# Patient Record
Sex: Female | Born: 1955 | Race: White | Hispanic: No | Smoking: Never smoker
Health system: Southern US, Community
[De-identification: ages and names within clinical notes are randomized; demographics above are authoritative.]

## PROBLEM LIST (undated history)

## (undated) HISTORY — PX: TUBAL LIGATION: SHX77

## (undated) HISTORY — PX: CHOLECYSTECTOMY: SHX55

## (undated) HISTORY — PX: NM RENAL LASIX (ARMC HX): HXRAD1213

## (undated) HISTORY — PX: BREAST BIOPSY: SHX20

---

## 2014-02-17 LAB — HM PAP SMEAR: HM Pap smear: NEGATIVE

## 2015-02-28 LAB — HM DEXA SCAN

## 2017-03-20 LAB — HM MAMMOGRAPHY

## 2018-06-19 ENCOUNTER — Other Ambulatory Visit: Payer: Self-pay | Admitting: Family Medicine

## 2018-06-19 DIAGNOSIS — Z1231 Encounter for screening mammogram for malignant neoplasm of breast: Secondary | ICD-10-CM

## 2018-07-01 ENCOUNTER — Other Ambulatory Visit: Payer: Self-pay | Admitting: Family Medicine

## 2018-07-01 DIAGNOSIS — Z13820 Encounter for screening for osteoporosis: Secondary | ICD-10-CM

## 2018-08-06 ENCOUNTER — Other Ambulatory Visit: Payer: Self-pay

## 2018-08-26 ENCOUNTER — Other Ambulatory Visit: Payer: Self-pay

## 2018-10-07 ENCOUNTER — Other Ambulatory Visit: Payer: Self-pay

## 2018-12-30 ENCOUNTER — Other Ambulatory Visit: Payer: Self-pay

## 2018-12-30 ENCOUNTER — Ambulatory Visit
Admission: RE | Admit: 2018-12-30 | Discharge: 2018-12-30 | Disposition: A | Discharge status: Home / Self Care | Payer: 59 | Source: Ambulatory Visit | Attending: Family Medicine | Admitting: Family Medicine

## 2018-12-30 DIAGNOSIS — Z1231 Encounter for screening mammogram for malignant neoplasm of breast: Secondary | ICD-10-CM | POA: Insufficient documentation

## 2018-12-30 DIAGNOSIS — Z13820 Encounter for screening for osteoporosis: Secondary | ICD-10-CM | POA: Insufficient documentation

## 2019-01-20 ENCOUNTER — Other Ambulatory Visit: Payer: Self-pay | Admitting: Family Medicine

## 2019-01-20 DIAGNOSIS — R928 Other abnormal and inconclusive findings on diagnostic imaging of breast: Secondary | ICD-10-CM

## 2019-01-27 ENCOUNTER — Other Ambulatory Visit: Payer: 59

## 2019-01-27 ENCOUNTER — Ambulatory Visit: Payer: 59

## 2019-02-10 ENCOUNTER — Other Ambulatory Visit: Payer: Self-pay | Admitting: Family Medicine

## 2019-02-10 DIAGNOSIS — R928 Other abnormal and inconclusive findings on diagnostic imaging of breast: Secondary | ICD-10-CM

## 2019-02-18 ENCOUNTER — Ambulatory Visit
Admission: RE | Admit: 2019-02-18 | Discharge: 2019-02-18 | Disposition: A | Discharge status: Home / Self Care | Payer: 59 | Source: Ambulatory Visit | Attending: Family Medicine | Admitting: Family Medicine

## 2019-02-18 DIAGNOSIS — R928 Other abnormal and inconclusive findings on diagnostic imaging of breast: Secondary | ICD-10-CM

## 2019-07-02 ENCOUNTER — Ambulatory Visit: Payer: 59 | Admitting: Nurse Practitioner

## 2019-07-07 ENCOUNTER — Other Ambulatory Visit: Payer: Self-pay

## 2019-07-07 ENCOUNTER — Ambulatory Visit (INDEPENDENT_AMBULATORY_CARE_PROVIDER_SITE_OTHER): Payer: No Typology Code available for payment source | Admitting: Nurse Practitioner

## 2019-07-07 ENCOUNTER — Encounter: Payer: Self-pay | Admitting: Nurse Practitioner

## 2019-07-07 VITALS — BP 105/70 | HR 69 | Temp 98.1°F | Ht 61.81 in | Wt 132.6 lb

## 2019-07-07 DIAGNOSIS — Z7689 Persons encountering health services in other specified circumstances: Secondary | ICD-10-CM

## 2019-07-07 DIAGNOSIS — M8580 Other specified disorders of bone density and structure, unspecified site: Secondary | ICD-10-CM | POA: Insufficient documentation

## 2019-07-07 DIAGNOSIS — E039 Hypothyroidism, unspecified: Secondary | ICD-10-CM

## 2019-07-07 DIAGNOSIS — M85851 Other specified disorders of bone density and structure, right thigh: Secondary | ICD-10-CM

## 2019-07-07 NOTE — Assessment & Plan Note (Signed)
Chronic, ongoing.  Continue current medication regimen and adjust as needed.  Plan on thyroid panel at next visit in 4 weeks, physical.

## 2019-07-07 NOTE — Patient Instructions (Signed)
Hypothyroidism  Hypothyroidism is when the thyroid gland does not make enough of certain hormones (it is underactive). The thyroid gland is a small gland located in the lower front part of the neck, just in front of the windpipe (trachea). This gland makes hormones that help control how the body uses food for energy (metabolism) as well as how the heart and brain function. These hormones also play a role in keeping your bones strong. When the thyroid is underactive, it produces too little of the hormones thyroxine (T4) and triiodothyronine (T3). What are the causes? This condition may be caused by:  Hashimoto's disease. This is a disease in which the body's disease-fighting system (immune system) attacks the thyroid gland. This is the most common cause.  Viral infections.  Pregnancy.  Certain medicines.  Birth defects.  Past radiation treatments to the head or neck for cancer.  Past treatment with radioactive iodine.  Past exposure to radiation in the environment.  Past surgical removal of part or all of the thyroid.  Problems with a gland in the center of the brain (pituitary gland).  Lack of enough iodine in the diet. What increases the risk? You are more likely to develop this condition if:  You are female.  You have a family history of thyroid conditions.  You use a medicine called lithium.  You take medicines that affect the immune system (immunosuppressants). What are the signs or symptoms? Symptoms of this condition include:  Feeling as though you have no energy (lethargy).  Not being able to tolerate cold.  Weight gain that is not explained by a change in diet or exercise habits.  Lack of appetite.  Dry skin.  Coarse hair.  Menstrual irregularity.  Slowing of thought processes.  Constipation.  Sadness or depression. How is this diagnosed? This condition may be diagnosed based on:  Your symptoms, your medical history, and a physical exam.  Blood  tests. You may also have imaging tests, such as an ultrasound or MRI. How is this treated? This condition is treated with medicine that replaces the thyroid hormones that your body does not make. After you begin treatment, it may take several weeks for symptoms to go away. Follow these instructions at home:  Take over-the-counter and prescription medicines only as told by your health care provider.  If you start taking any new medicines, tell your health care provider.  Keep all follow-up visits as told by your health care provider. This is important. ? As your condition improves, your dosage of thyroid hormone medicine may change. ? You will need to have blood tests regularly so that your health care provider can monitor your condition. Contact a health care provider if:  Your symptoms do not get better with treatment.  You are taking thyroid replacement medicine and you: ? Sweat a lot. ? Have tremors. ? Feel anxious. ? Lose weight rapidly. ? Cannot tolerate heat. ? Have emotional swings. ? Have diarrhea. ? Feel weak. Get help right away if you have:  Chest pain.  An irregular heartbeat.  A rapid heartbeat.  Difficulty breathing. Summary  Hypothyroidism is when the thyroid gland does not make enough of certain hormones (it is underactive).  When the thyroid is underactive, it produces too little of the hormones thyroxine (T4) and triiodothyronine (T3).  The most common cause is Hashimoto's disease, a disease in which the body's disease-fighting system (immune system) attacks the thyroid gland. The condition can also be caused by viral infections, medicine, pregnancy, or past   radiation treatment to the head or neck.  Symptoms may include weight gain, dry skin, constipation, feeling as though you do not have energy, and not being able to tolerate cold.  This condition is treated with medicine to replace the thyroid hormones that your body does not make. This information  is not intended to replace advice given to you by your health care provider. Make sure you discuss any questions you have with your health care provider. Document Revised: 04/25/2017 Document Reviewed: 04/23/2017 Elsevier Patient Education  2020 [employer]  

## 2019-07-07 NOTE — Progress Notes (Signed)
New Patient Office Visit  Subjective:  Patient ID: Ashley Lawrence, female    DOB: May 15, 1956  Age: 64 y.o. MRN: [id number]  CC:  Chief Complaint  Patient presents with  . Establish Care    HPI Ashley Lawrence presents for new patient visit to establish care.  Introduced to Publishing rights manager role and practice setting.  All questions answered.  Works for [employer] and lives in [place].  [place] here [place] in [place].  Lived there for 30 years.  Saw nurse practitioners while living there.  Last physical was January 2020, by Dr. Allena Katz.  HYPOTHYROIDISM Is taking Levothyroxine 50 MCG, diagnosed 5 years ago.  Did have osteopenia noted on DEXA in 2020, January, takes daily supplements. Thyroid control status:stable Satisfied with current treatment? yes Medication side effects: no Medication compliance: good compliance Etiology of hypothyroidism:  Recent dose adjustment:no Fatigue: no Cold intolerance: no Heat intolerance: no Weight gain: no Weight loss: no Constipation: no Diarrhea/loose stools: no Palpitations: no Lower extremity edema: no Anxiety/depressed mood: no   History reviewed. No pertinent past medical history.  Past Surgical History:  Procedure Laterality Date  . BREAST BIOPSY Left    neg  . CHOLECYSTECTOMY    . NM RENAL LASIX ([facility] HX)    . TUBAL LIGATION      Family History  Problem Relation Age of Onset  . Breast cancer Neg Hx     Social History   Socioeconomic History  . Marital status: Unknown    Spouse name: Not on file  . Number of children: Not on file  . Years of education: Not on file  . Highest education level: Not on file  Occupational History    Employer: [employer]    Comment: contract stuff  Tobacco Use  . Smoking status: Never Smoker  . Smokeless tobacco: Never Used  Substance and Sexual Activity  . Alcohol use: Never  . Drug use: Never  . Sexual activity: Yes  Other Topics Concern  . Not on file  Social History Narrative  . Not on file    Social Determinants of Health   Financial Resource Strain: Low Risk   . Difficulty of Paying Living Expenses: Not hard at all  Food Insecurity: No Food Insecurity  . Worried About Programme researcher, broadcasting/film/video in the Last Year: Never true  . Ran Out of Food in the Last Year: Never true  Transportation Needs: No Transportation Needs  . Lack of Transportation (Medical): No  . Lack of Transportation (Non-Medical): No  Physical Activity: Sufficiently Active  . Days of Exercise per Week: 5 days  . Minutes of Exercise per Session: 30 min  Stress: No Stress Concern Present  . Feeling of Stress : Not at all  Social Connections: Slightly Isolated  . Frequency of Communication with Friends and Family: Three times a week  . Frequency of Social Gatherings with Friends and Family: Three times a week  . Attends Religious Services: More than 4 times per year  . Active Member of Clubs or Organizations: No  . Attends Banker Meetings: Never  . Marital Status: Married  Catering manager Violence:   . Fear of Current or Ex-Partner: Not on file  . Emotionally Abused: Not on file  . Physically Abused: Not on file  . Sexually Abused: Not on file    ROS Review of Systems  Constitutional: Negative for activity change, appetite change, diaphoresis, fatigue and fever.  Respiratory: Negative for cough, chest tightness and shortness of breath.  Cardiovascular: Negative for chest pain, palpitations and leg swelling.  Gastrointestinal: Negative.   Endocrine: Negative for cold intolerance and heat intolerance.  Neurological: Negative.   Psychiatric/Behavioral: Negative.     Objective:   Today's Vitals: BP 105/70 (BP Location: Left Arm, Patient Position: Sitting, Cuff Size: Normal)   Pulse 69   Temp 98.1 F (36.7 C) (Oral)   Ht 5' 1.81" (1.57 m)   Wt 132 lb 9.6 oz (60.1 kg)   SpO2 100%   BMI 24.40 kg/m   Physical Exam Vitals and nursing note reviewed.  Constitutional:      General: She  is awake. She is not in acute distress.    Appearance: She is well-developed and well-groomed. She is not ill-appearing.  HENT:     Head: Normocephalic.     Right Ear: Hearing normal.     Left Ear: Hearing normal.  Eyes:     General: Lids are normal.        Right eye: No discharge.        Left eye: No discharge.     Conjunctiva/sclera: Conjunctivae normal.     Pupils: Pupils are equal, round, and reactive to light.  Neck:     Thyroid: No thyromegaly.     Vascular: No carotid bruit.  Cardiovascular:     Rate and Rhythm: Normal rate and regular rhythm.     Heart sounds: Normal heart sounds. No murmur. No gallop.   Pulmonary:     Effort: Pulmonary effort is normal. No accessory muscle usage or respiratory distress.     Breath sounds: Normal breath sounds.  Abdominal:     General: Bowel sounds are normal.     Palpations: Abdomen is soft.  Musculoskeletal:     Cervical back: Normal range of motion and neck supple.     Right lower leg: No edema.     Left lower leg: No edema.  Lymphadenopathy:     Head:     Right side of head: No submental, submandibular, tonsillar, preauricular or posterior auricular adenopathy.     Left side of head: No submental, submandibular, tonsillar, preauricular or posterior auricular adenopathy.  Skin:    General: Skin is warm and dry.  Neurological:     Mental Status: She is alert and oriented to person, place, and time.  Psychiatric:        Attention and Perception: Attention normal.        Mood and Affect: Mood normal.        Behavior: Behavior normal. Behavior is cooperative.        Thought Content: Thought content normal.        Judgment: Judgment normal.     Assessment & Plan:   Problem List Items Addressed This Visit      Endocrine   Hypothyroid    Chronic, ongoing.  Continue current medication regimen and adjust as needed.  Plan on thyroid panel at next visit in 4 weeks, physical.      Relevant Medications   levothyroxine (SYNTHROID)  50 MCG tablet     Musculoskeletal and Integument   Osteopenia    Noted on DEXA in 2020.  Continue on calcium and Vit D supplement daily.  Repeat DEXA in 2025.  Check Vit D at physical in 4 weeks.       Other Visit Diagnoses    Encounter to establish care    -  Primary      Outpatient Encounter Medications as of 07/07/2019  Medication Sig  .  calcium carbonate (OSCAL) 1500 (600 Ca) MG TABS tablet Take 600 mg by mouth 2 (two) times daily with a meal.  . cholecalciferol (VITAMIN D3) 25 MCG (1000 UNIT) tablet Take 4,000 Units by mouth daily.  Marland Kitchen levothyroxine (SYNTHROID) 50 MCG tablet Take 50 mcg by mouth daily.   No facility-administered encounter medications on file as of 07/07/2019.    Follow-up: Return in about 4 weeks (around 08/04/2019) for Annual physical.   Venita Lick, NP

## 2019-07-07 NOTE — Assessment & Plan Note (Addendum)
Noted on DEXA in 2020.  Continue on calcium and Vit D supplement daily.  Repeat DEXA in 2025.  Check Vit D at physical in 4 weeks.

## 2019-08-11 ENCOUNTER — Other Ambulatory Visit: Payer: Self-pay

## 2019-08-11 ENCOUNTER — Ambulatory Visit (INDEPENDENT_AMBULATORY_CARE_PROVIDER_SITE_OTHER): Payer: 59 | Admitting: Nurse Practitioner

## 2019-08-11 ENCOUNTER — Other Ambulatory Visit (HOSPITAL_COMMUNITY)
Admission: RE | Admit: 2019-08-11 | Discharge: 2019-08-11 | Disposition: A | Discharge status: Home / Self Care | Payer: 59 | Source: Ambulatory Visit | Attending: Nurse Practitioner | Admitting: Nurse Practitioner

## 2019-08-11 ENCOUNTER — Encounter: Payer: Self-pay | Admitting: Nurse Practitioner

## 2019-08-11 VITALS — BP 89/57 | HR 65 | Temp 97.7°F | Ht 62.6 in | Wt 132.2 lb

## 2019-08-11 DIAGNOSIS — M85851 Other specified disorders of bone density and structure, right thigh: Secondary | ICD-10-CM | POA: Diagnosis not present

## 2019-08-11 DIAGNOSIS — Z0000 Encounter for general adult medical examination without abnormal findings: Secondary | ICD-10-CM

## 2019-08-11 DIAGNOSIS — Z124 Encounter for screening for malignant neoplasm of cervix: Secondary | ICD-10-CM | POA: Diagnosis not present

## 2019-08-11 DIAGNOSIS — E039 Hypothyroidism, unspecified: Secondary | ICD-10-CM | POA: Diagnosis not present

## 2019-08-11 DIAGNOSIS — Z13220 Encounter for screening for lipoid disorders: Secondary | ICD-10-CM

## 2019-08-11 DIAGNOSIS — Z1231 Encounter for screening mammogram for malignant neoplasm of breast: Secondary | ICD-10-CM

## 2019-08-11 DIAGNOSIS — E559 Vitamin D deficiency, unspecified: Secondary | ICD-10-CM

## 2019-08-11 NOTE — Patient Instructions (Signed)
Healthy Eating Following a healthy eating pattern may help you to achieve and maintain a healthy body weight, reduce the risk of chronic disease, and live a long and productive life. It is important to follow a healthy eating pattern at an appropriate calorie level for your body. Your nutritional needs should be met primarily through food by choosing a variety of nutrient-rich foods. What are tips for following this plan? Reading food labels  Read labels and choose the following: ? Reduced or low sodium. ? Juices with 100% fruit juice. ? Foods with low saturated fats and high polyunsaturated and monounsaturated fats. ? Foods with whole grains, such as whole wheat, cracked wheat, brown rice, and wild rice. ? Whole grains that are fortified with folic acid. This is recommended for women who are pregnant or who want to become pregnant.  Read labels and avoid the following: ? Foods with a lot of added sugars. These include foods that contain brown sugar, corn sweetener, corn syrup, dextrose, fructose, glucose, high-fructose corn syrup, honey, invert sugar, lactose, malt syrup, maltose, molasses, raw sugar, sucrose, trehalose, or turbinado sugar.  Do not eat more than the following amounts of added sugar per day:  6 teaspoons (25 g) for women.  9 teaspoons (38 g) for men. ? Foods that contain processed or refined starches and grains. ? Refined grain products, such as white flour, degermed cornmeal, white bread, and white rice. Shopping  Choose nutrient-rich snacks, such as vegetables, whole fruits, and nuts. Avoid high-calorie and high-sugar snacks, such as potato chips, fruit snacks, and candy.  Use oil-based dressings and spreads on foods instead of solid fats such as butter, stick margarine, or cream cheese.  Limit pre-made sauces, mixes, and "instant" products such as flavored rice, instant noodles, and ready-made pasta.  Try more plant-protein sources, such as tofu, tempeh, black beans,  edamame, lentils, nuts, and seeds.  Explore eating plans such as the Mediterranean diet or vegetarian diet. Cooking  Use oil to saut or stir-fry foods instead of solid fats such as butter, stick margarine, or lard.  Try baking, boiling, grilling, or broiling instead of frying.  Remove the fatty part of meats before cooking.  Steam vegetables in water or broth. Meal planning   At meals, imagine dividing your plate into fourths: ? One-half of your plate is fruits and vegetables. ? One-fourth of your plate is whole grains. ? One-fourth of your plate is protein, especially lean meats, poultry, eggs, tofu, beans, or nuts.  Include low-fat dairy as part of your daily diet. Lifestyle  Choose healthy options in all settings, including home, work, school, restaurants, or stores.  Prepare your food safely: ? Wash your hands after handling raw meats. ? Keep food preparation surfaces clean by regularly washing with hot, soapy water. ? Keep raw meats separate from ready-to-eat foods, such as fruits and vegetables. ? Cook seafood, meat, poultry, and eggs to the recommended internal temperature. ? Store foods at safe temperatures. In general:  Keep cold foods at 59F (4.4C) or below.  Keep hot foods at 159F (60C) or above.  Keep your freezer at [facility] (-17.8C) or below.  Foods are no longer safe to eat when they have been between the temperatures of 40-159F (4.4-60C) for more than 2 hours. What foods should I eat? Fruits Aim to eat 2 cup-equivalents of fresh, canned (in natural juice), or frozen fruits each day. Examples of 1 cup-equivalent of fruit include 1 small apple, 8 large strawberries, 1 cup canned fruit,  cup  dried fruit, or 1 cup 100% juice. Vegetables Aim to eat 2-3 cup-equivalents of fresh and frozen vegetables each day, including different varieties and colors. Examples of 1 cup-equivalent of vegetables include 2 medium carrots, 2 cups raw, leafy greens, 1 cup chopped  vegetable (raw or cooked), or 1 medium baked potato. Grains Aim to eat 6 ounce-equivalents of whole grains each day. Examples of 1 ounce-equivalent of grains include 1 slice of bread, 1 cup ready-to-eat cereal, 3 cups popcorn, or  cup cooked rice, pasta, or cereal. Meats and other proteins Aim to eat 5-6 ounce-equivalents of protein each day. Examples of 1 ounce-equivalent of protein include 1 egg, 1/2 cup nuts or seeds, or 1 tablespoon (16 g) peanut butter. A cut of meat or fish that is the size of a deck of cards is about 3-4 ounce-equivalents.  Of the protein you eat each week, try to have at least 8 ounces come from seafood. This includes salmon, trout, herring, and anchovies. Dairy Aim to eat 3 cup-equivalents of fat-free or low-fat dairy each day. Examples of 1 cup-equivalent of dairy include 1 cup (240 mL) milk, 8 ounces (250 g) yogurt, 1 ounces (44 g) natural cheese, or 1 cup (240 mL) fortified soy milk. Fats and oils  Aim for about 5 teaspoons (21 g) per day. Choose monounsaturated fats, such as canola and olive oils, avocados, peanut butter, and most nuts, or polyunsaturated fats, such as sunflower, corn, and soybean oils, walnuts, pine nuts, sesame seeds, sunflower seeds, and flaxseed. Beverages  Aim for six 8-oz glasses of water per day. Limit coffee to three to five 8-oz cups per day.  Limit caffeinated beverages that have added calories, such as soda and energy drinks.  Limit alcohol intake to no more than 1 drink a day for nonpregnant women and 2 drinks a day for men. One drink equals 12 oz of beer (355 mL), 5 oz of wine (148 mL), or 1 oz of hard liquor (44 mL). Seasoning and other foods  Avoid adding excess amounts of salt to your foods. Try flavoring foods with herbs and spices instead of salt.  Avoid adding sugar to foods.  Try using oil-based dressings, sauces, and spreads instead of solid fats. This information is based on general [place] nutrition guidelines. For more  information, visit [employer]. Exact amounts may vary based on your nutrition needs. Summary  A healthy eating plan may help you to maintain a healthy weight, reduce the risk of chronic diseases, and stay active throughout your life.  Plan your meals. Make sure you eat the right portions of a variety of nutrient-rich foods.  Try baking, boiling, grilling, or broiling instead of frying.  Choose healthy options in all settings, including home, work, school, restaurants, or stores. This information is not intended to replace advice given to you by your health care provider. Make sure you discuss any questions you have with your health care provider. Document Revised: 08/25/2017 Document Reviewed: 08/25/2017 Elsevier Patient Education  [place].

## 2019-08-11 NOTE — Progress Notes (Signed)
BP (!) 89/57   Pulse 65   Temp 97.7 F (36.5 C) (Oral)   Ht 5' 2.6" (1.59 m)   Wt 132 lb 3.2 oz (60 kg)   SpO2 99%   BMI 23.72 kg/m    Subjective:    Patient ID: Ashley Lawrence, female    DOB: 1955-08-29, 64 y.o.   MRN: [id number]  HPI: Ashley Lawrence is a 64 y.o. female presenting on 08/11/2019 for comprehensive medical examination. Current medical complaints include:none  She currently lives with: husband Menopausal Symptoms: no   HYPOTHYROIDISM Is taking Levothyroxine 50 MCG, diagnosed 5 years ago.  Did have osteopenia noted on DEXA in 2020, January, takes daily supplements. Thyroid control status:stable Satisfied with current treatment? yes Medication side effects: no Medication compliance: good compliance Etiology of hypothyroidism:  Recent dose adjustment:no Fatigue: no Cold intolerance: yes -- at baseline Heat intolerance: no Weight gain: no Weight loss: no Constipation: no Diarrhea/loose stools: no Palpitations: no Lower extremity edema: no Anxiety/depressed mood: no  Depression Screen done today and results listed below:  Depression screen [facility] 2/9 08/11/2019  Decreased Interest 0  Down, Depressed, Hopeless 0  PHQ - 2 Score 0    The patient does not have a history of falls. I did not complete a risk assessment for falls. A plan of care for falls was not documented.   Past Medical History:  History reviewed. No pertinent past medical history.  Surgical History:  Past Surgical History:  Procedure Laterality Date  . BREAST BIOPSY Left    neg  . CHOLECYSTECTOMY    . NM RENAL LASIX ([facility] HX)    . TUBAL LIGATION      Medications:  Current Outpatient Medications on File Prior to Visit  Medication Sig  . calcium carbonate (OSCAL) 1500 (600 Ca) MG TABS tablet Take 600 mg by mouth 2 (two) times daily with a meal.  . cholecalciferol (VITAMIN D3) 25 MCG (1000 UNIT) tablet Take 4,000 Units by mouth daily.  Marland Kitchen levothyroxine (SYNTHROID) 50 MCG tablet Take 50 mcg by  mouth daily.   No current facility-administered medications on file prior to visit.    Allergies:  No Known Allergies  Social History:  Social History   Socioeconomic History  . Marital status: Unknown    Spouse name: Not on file  . Number of children: Not on file  . Years of education: Not on file  . Highest education level: Not on file  Occupational History    Employer: [employer]    Comment: contract stuff  Tobacco Use  . Smoking status: Never Smoker  . Smokeless tobacco: Never Used  Substance and Sexual Activity  . Alcohol use: Never  . Drug use: Never  . Sexual activity: Yes  Other Topics Concern  . Not on file  Social History Narrative  . Not on file   Social Determinants of Health   Financial Resource Strain: Low Risk   . Difficulty of Paying Living Expenses: Not hard at all  Food Insecurity: No Food Insecurity  . Worried About Programme researcher, broadcasting/film/video in the Last Year: Never true  . Ran Out of Food in the Last Year: Never true  Transportation Needs: No Transportation Needs  . Lack of Transportation (Medical): No  . Lack of Transportation (Non-Medical): No  Physical Activity: Sufficiently Active  . Days of Exercise per Week: 5 days  . Minutes of Exercise per Session: 30 min  Stress: No Stress Concern Present  . Feeling of Stress : Not  at all  Social Connections: Slightly Isolated  . Frequency of Communication with Friends and Family: Three times a week  . Frequency of Social Gatherings with Friends and Family: Three times a week  . Attends Religious Services: More than 4 times per year  . Active Member of Clubs or Organizations: No  . Attends Banker Meetings: Never  . Marital Status: Married  Catering manager Violence:   . Fear of Current or Ex-Partner:   . Emotionally Abused:   Marland Kitchen Physically Abused:   . Sexually Abused:    Social History   Tobacco Use  Smoking Status Never Smoker  Smokeless Tobacco Never Used   Social History   Substance  and Sexual Activity  Alcohol Use Never    Family History:  Family History  Problem Relation Age of Onset  . Breast cancer Neg Hx     Past medical history, surgical history, medications, allergies, family history and social history reviewed with patient today and changes made to appropriate areas of the chart.   Review of Systems - negative All other ROS negative except what is listed above and in the HPI.      Objective:    BP (!) 89/57   Pulse 65   Temp 97.7 F (36.5 C) (Oral)   Ht 5' 2.6" (1.59 m)   Wt 132 lb 3.2 oz (60 kg)   SpO2 99%   BMI 23.72 kg/m   Wt Readings from Last 3 Encounters:  08/11/19 132 lb 3.2 oz (60 kg)  07/07/19 132 lb 9.6 oz (60.1 kg)    Physical Exam Constitutional:      General: She is awake. She is not in acute distress.    Appearance: She is well-developed. She is not ill-appearing.  HENT:     Head: Normocephalic and atraumatic.     Right Ear: Hearing, tympanic membrane, ear canal and external ear normal. No drainage.     Left Ear: Hearing, tympanic membrane, ear canal and external ear normal. No drainage.     Nose: Nose normal.     Right Sinus: No maxillary sinus tenderness or frontal sinus tenderness.     Left Sinus: No maxillary sinus tenderness or frontal sinus tenderness.     Mouth/Throat:     Mouth: Mucous membranes are moist.     Pharynx: Oropharynx is clear. Uvula midline. No pharyngeal swelling, oropharyngeal exudate or posterior oropharyngeal erythema.  Eyes:     General: Lids are normal.        Right eye: No discharge.        Left eye: No discharge.     Extraocular Movements: Extraocular movements intact.     Conjunctiva/sclera: Conjunctivae normal.     Pupils: Pupils are equal, round, and reactive to light.     Visual Fields: Right eye visual fields normal and left eye visual fields normal.  Neck:     Thyroid: No thyromegaly.     Vascular: No carotid bruit.     Trachea: Trachea normal.  Cardiovascular:     Rate and Rhythm:  Normal rate and regular rhythm.     Heart sounds: Normal heart sounds. No murmur. No gallop.   Pulmonary:     Effort: Pulmonary effort is normal. No accessory muscle usage or respiratory distress.     Breath sounds: Normal breath sounds.  Chest:     Breasts:        Right: Normal.        Left: Normal.  Abdominal:  General: Bowel sounds are normal.     Palpations: Abdomen is soft. There is no hepatomegaly or splenomegaly.     Tenderness: There is no abdominal tenderness.  Genitourinary:    Exam position: Lithotomy position.     Labia:        Right: No rash.        Left: No rash.      Vagina: Normal.     Cervix: Normal.     Uterus: Normal.      Adnexa: Right adnexa normal and left adnexa normal.     Rectum: External hemorrhoid present.     Comments: Cervix anterior, slightly friable with scant bleeding with pap present. Musculoskeletal:        General: Normal range of motion.     Cervical back: Normal range of motion and neck supple.     Right lower leg: No edema.     Left lower leg: No edema.  Lymphadenopathy:     Head:     Right side of head: No submental, submandibular, tonsillar, preauricular or posterior auricular adenopathy.     Left side of head: No submental, submandibular, tonsillar, preauricular or posterior auricular adenopathy.     Cervical: No cervical adenopathy.     Upper Body:     Right upper body: No supraclavicular, axillary or pectoral adenopathy.     Left upper body: No supraclavicular, axillary or pectoral adenopathy.  Skin:    General: Skin is warm and dry.     Capillary Refill: Capillary refill takes less than 2 seconds.     Findings: No rash.  Neurological:     Mental Status: She is alert and oriented to person, place, and time.     Cranial Nerves: Cranial nerves are intact.     Gait: Gait is intact.     Deep Tendon Reflexes: Reflexes are normal and symmetric.     Reflex Scores:      Brachioradialis reflexes are 2+ on the right side and 2+ on the  left side.      Patellar reflexes are 2+ on the right side and 2+ on the left side. Psychiatric:        Attention and Perception: Attention normal.        Mood and Affect: Mood normal.        Speech: Speech normal.        Behavior: Behavior normal. Behavior is cooperative.        Thought Content: Thought content normal.        Judgment: Judgment normal.    Results for orders placed or performed in visit on 07/29/19  HM DEXA SCAN  Result Value Ref Range   HM Dexa Scan See Report in Chart   HM MAMMOGRAPHY  Result Value Ref Range   HM Mammogram 0-4 Bi-Rad 0-4 Bi-Rad, [place] Reported Normal  HM PAP SMEAR  Result Value Ref Range   HM Pap smear Negative       Assessment & Plan:   Problem List Items Addressed This Visit      Endocrine   Hypothyroid    Chronic, ongoing.  Continue current medication regimen and adjust as needed.  Thyroid panel today.      Relevant Orders   Thyroid Panel With TSH     Musculoskeletal and Integument   Osteopenia    Noted on DEXA in 2020.  Continue on calcium and Vit D supplement daily.  Repeat DEXA in 2025.  Check Vit D level today.  Relevant Orders   VITAMIN D 25 Hydroxy (Vit-D Deficiency, Fractures)    Other Visit Diagnoses    Annual physical exam    -  Primary   Obtain annual labs to include CBC, CMP, TSH, lipid   Relevant Orders   CBC with Differential/Platelet   Comprehensive metabolic panel   Lipid Panel w/o Chol/HDL Ratio   Vitamin D deficiency       History of low level, takes daily supplement for osteopenia, check level today.   Relevant Orders   VITAMIN D 25 Hydroxy (Vit-D Deficiency, Fractures)   Screening cholesterol level       Lipid panel today.   Relevant Orders   Lipid Panel w/o Chol/HDL Ratio   Breast cancer screening by mammogram       Mammogram ordered today, due in August.   Relevant Orders   MM DIGITAL SCREENING BILATERAL       Follow up plan: Return in about 6 months (around 02/11/2020) for  Hypothyroid.   LABORATORY TESTING:  - Pap smear: pap done  IMMUNIZATIONS:   - Tdap: Tetanus vaccination status reviewed: awaiting past records to review. - Influenza: Refused - Pneumovax: Not applicable - Prevnar: Not applicable - HPV: Not applicable - Zostavax vaccine: Refused  SCREENING: -Mammogram: Ordered today  - Colonoscopy: awaiting past records, thinks she did Cologuard last year  - Bone Density: due in 2025  -Hearing Test: Not applicable  -Spirometry: Not applicable   PATIENT COUNSELING:   Advised to take 1 mg of folate supplement per day if capable of pregnancy.   Sexuality: Discussed sexually transmitted diseases, partner selection, use of condoms, avoidance of unintended pregnancy  and contraceptive alternatives.   Advised to avoid cigarette smoking.  I discussed with the patient that most people either abstain from alcohol or drink within safe limits (<=14/week and <=4 drinks/occasion for males, <=7/weeks and <= 3 drinks/occasion for females) and that the risk for alcohol disorders and other health effects rises proportionally with the number of drinks per week and how often a drinker exceeds daily limits.  Discussed cessation/primary prevention of drug use and availability of treatment for abuse.   Diet: Encouraged to adjust caloric intake to maintain  or achieve ideal body weight, to reduce intake of dietary saturated fat and total fat, to limit sodium intake by avoiding high sodium foods and not adding table salt, and to maintain adequate dietary potassium and calcium preferably from fresh fruits, vegetables, and low-fat dairy products.    stressed the importance of regular exercise  Injury prevention: Discussed safety belts, safety helmets, smoke detector, smoking near bedding or upholstery.   Dental health: Discussed importance of regular tooth brushing, flossing, and dental visits.    NEXT PREVENTATIVE PHYSICAL DUE IN 1 YEAR. Return in about 6 months  (around 02/11/2020) for Hypothyroid.

## 2019-08-11 NOTE — Assessment & Plan Note (Signed)
Noted on DEXA in 2020.  Continue on calcium and Vit D supplement daily.  Repeat DEXA in 2025.  Check Vit D level today.

## 2019-08-11 NOTE — Assessment & Plan Note (Signed)
Chronic, ongoing.  Continue current medication regimen and adjust as needed.  Thyroid panel today. 

## 2019-08-12 LAB — COMPREHENSIVE METABOLIC PANEL
ALT: 13 IU/L (ref 0–32)
AST: 21 IU/L (ref 0–40)
Albumin/Globulin Ratio: 2.9 — ABNORMAL HIGH (ref 1.2–2.2)
Albumin: 4.6 g/dL (ref 3.8–4.8)
Alkaline Phosphatase: 65 IU/L (ref 39–117)
BUN/Creatinine Ratio: 12 (ref 12–28)
BUN: 11 mg/dL (ref 8–27)
Bilirubin Total: 0.5 mg/dL (ref 0.0–1.2)
CO2: 26 mmol/L (ref 20–29)
Calcium: 8.9 mg/dL (ref 8.7–10.3)
Chloride: 105 mmol/L (ref 96–106)
Creatinine, Ser: 0.92 mg/dL (ref 0.57–1.00)
GFR calc Af Amer: 77 mL/min/{1.73_m2} (ref >59)
GFR calc non Af Amer: 66 mL/min/{1.73_m2} (ref >59)
Globulin, Total: 1.6 g/dL (ref 1.5–4.5)
Glucose: 74 mg/dL (ref 65–99)
Potassium: 3.8 mmol/L (ref 3.5–5.2)
Sodium: 143 mmol/L (ref 134–144)
Total Protein: 6.2 g/dL (ref 6.0–8.5)

## 2019-08-12 LAB — CBC WITH DIFFERENTIAL/PLATELET
Basophils Absolute: 0 10*3/uL (ref 0.0–0.2)
Basos: 1 %
EOS (ABSOLUTE): 0.1 10*3/uL (ref 0.0–0.4)
Eos: 3 %
Hematocrit: 45.4 % (ref 34.0–46.6)
Hemoglobin: 15.3 g/dL (ref 11.1–15.9)
Immature Grans (Abs): 0 10*3/uL (ref 0.0–0.1)
Immature Granulocytes: 0 %
Lymphocytes Absolute: 1.1 10*3/uL (ref 0.7–3.1)
Lymphs: 28 %
MCH: 30.9 pg (ref 26.6–33.0)
MCHC: 33.7 g/dL (ref 31.5–35.7)
MCV: 92 fL (ref 79–97)
Monocytes Absolute: 0.3 10*3/uL (ref 0.1–0.9)
Monocytes: 7 %
Neutrophils Absolute: 2.5 10*3/uL (ref 1.4–7.0)
Neutrophils: 61 %
Platelets: 217 10*3/uL (ref 150–450)
RBC: 4.95 x10E6/uL (ref 3.77–5.28)
RDW: 12.9 % (ref 11.7–15.4)
WBC: 4 10*3/uL (ref 3.4–10.8)

## 2019-08-12 LAB — CYTOLOGY - PAP
Comment: NEGATIVE
Diagnosis: NEGATIVE
High risk HPV: NEGATIVE

## 2019-08-12 LAB — VITAMIN D 25 HYDROXY (VIT D DEFICIENCY, FRACTURES): Vit D, 25-Hydroxy: 44 ng/mL (ref 30.0–100.0)

## 2019-08-12 LAB — THYROID PANEL WITH TSH
Free Thyroxine Index: 2.1 (ref 1.2–4.9)
T3 Uptake Ratio: 26 % (ref 24–39)
T4, Total: 7.9 ug/dL (ref 4.5–12.0)
TSH: 2.49 u[IU]/mL (ref 0.450–4.500)

## 2019-08-12 LAB — LIPID PANEL W/O CHOL/HDL RATIO
Cholesterol, Total: 202 mg/dL — ABNORMAL HIGH (ref 100–199)
HDL: 78 mg/dL (ref >39)
LDL Chol Calc (NIH): 111 mg/dL — ABNORMAL HIGH (ref 0–99)
Triglycerides: 70 mg/dL (ref 0–149)
VLDL Cholesterol Cal: 13 mg/dL (ref 5–40)

## 2019-08-12 NOTE — Progress Notes (Signed)
Contacted via MyChart

## 2019-08-12 NOTE — Progress Notes (Signed)
Contacted via MyChart The ASCVD Risk score Denman George DC Jr., et al., 2013) failed to calculate for the following reasons:   The valid systolic blood pressure range is 90 to 200 mmHg

## 2019-08-24 ENCOUNTER — Other Ambulatory Visit: Payer: Self-pay | Admitting: Nurse Practitioner

## 2019-08-24 ENCOUNTER — Encounter: Payer: Self-pay | Admitting: Nurse Practitioner

## 2019-08-24 MED ORDER — LEVOTHYROXINE SODIUM 50 MCG PO TABS: 50.0000 ug | ORAL_TABLET | Freq: Every day | ORAL | 4 refills | Status: DC

## 2020-01-05 ENCOUNTER — Encounter: Payer: Self-pay | Admitting: Nurse Practitioner

## 2020-02-08 ENCOUNTER — Ambulatory Visit: Payer: 59 | Admitting: Nurse Practitioner

## 2020-02-09 ENCOUNTER — Ambulatory Visit: Payer: 59 | Admitting: Nurse Practitioner

## 2020-02-15 ENCOUNTER — Ambulatory Visit: Payer: 59 | Admitting: Nurse Practitioner

## 2020-02-27 ENCOUNTER — Encounter: Payer: Self-pay | Admitting: Nurse Practitioner

## 2020-02-27 DIAGNOSIS — E7800 Pure hypercholesterolemia, unspecified: Secondary | ICD-10-CM | POA: Insufficient documentation

## 2020-03-01 ENCOUNTER — Encounter: Payer: Self-pay | Admitting: Nurse Practitioner

## 2020-03-01 ENCOUNTER — Other Ambulatory Visit: Payer: Self-pay

## 2020-03-01 ENCOUNTER — Ambulatory Visit (INDEPENDENT_AMBULATORY_CARE_PROVIDER_SITE_OTHER): Payer: No Typology Code available for payment source | Admitting: Nurse Practitioner

## 2020-03-01 VITALS — BP 100/63 | HR 62 | Temp 98.3°F | Ht 62.0 in | Wt 131.8 lb

## 2020-03-01 DIAGNOSIS — E039 Hypothyroidism, unspecified: Secondary | ICD-10-CM

## 2020-03-01 NOTE — Progress Notes (Signed)
BP 100/63 (BP Location: Left Arm)   Pulse 62   Temp 98.3 F (36.8 C) (Oral)   Ht 5\' 2"  (1.575 m)   Wt 131 lb 12.8 oz (59.8 kg)   SpO2 97%   BMI 24.11 kg/m    Subjective:    Patient ID: , female    DOB: [birth year], 64 y.o.   MRN: [id number]  HPI: Ashley Lawrence is a 64 y.o. female  Chief Complaint  Patient presents with  . Hypothyroidism   HYPOTHYROIDISM Continues on Levothyroxine 50 MCG.  She feels like she is gaining weight recently and is not eating a lot. Thyroid control status:stable Satisfied with current treatment? yes Medication side effects: no Medication compliance: good compliance Etiology of hypothyroidism: unknown Recent dose adjustment:no Fatigue: no Cold intolerance: yes Heat intolerance: no Weight gain: yes -- gained about 5 pounds in past year -- had been 123 lbs Weight loss: no Constipation: no Diarrhea/loose stools: no Palpitations: no Lower extremity edema: no Anxiety/depressed mood: no  Relevant past medical, surgical, family and social history reviewed and updated as indicated. Interim medical history since our last visit reviewed. Allergies and medications reviewed and updated.  Review of Systems  Constitutional: Negative for activity change, appetite change, diaphoresis, fatigue and fever.  Respiratory: Negative for cough, chest tightness and shortness of breath.   Cardiovascular: Negative for chest pain, palpitations and leg swelling.  Gastrointestinal: Negative.   Endocrine: Negative for cold intolerance and heat intolerance.  Neurological: Negative.   Psychiatric/Behavioral: Negative.     Per HPI unless specifically indicated above     Objective:    BP 100/63 (BP Location: Left Arm)   Pulse 62   Temp 98.3 F (36.8 C) (Oral)   Ht 5\' 2"  (1.575 m)   Wt 131 lb 12.8 oz (59.8 kg)   SpO2 97%   BMI 24.11 kg/m   Wt Readings from Last 3 Encounters:  03/01/20 131 lb 12.8 oz (59.8 kg)  08/11/19 132 lb 3.2 oz (60 kg)  07/07/19  132 lb 9.6 oz (60.1 kg)    Physical Exam Vitals and nursing note reviewed.  Constitutional:      General: She is awake. She is not in acute distress.    Appearance: She is well-developed and well-groomed. She is not ill-appearing.  HENT:     Head: Normocephalic.     Right Ear: Hearing normal.     Left Ear: Hearing normal.  Eyes:     General: Lids are normal.        Right eye: No discharge.        Left eye: No discharge.     Conjunctiva/sclera: Conjunctivae normal.     Pupils: Pupils are equal, round, and reactive to light.  Neck:     Thyroid: No thyromegaly.     Vascular: No carotid bruit.  Cardiovascular:     Rate and Rhythm: Normal rate and regular rhythm.     Heart sounds: Normal heart sounds. No murmur heard.  No gallop.   Pulmonary:     Effort: Pulmonary effort is normal. No accessory muscle usage or respiratory distress.     Breath sounds: Normal breath sounds.  Abdominal:     General: Bowel sounds are normal.     Palpations: Abdomen is soft.  Musculoskeletal:     Cervical back: Normal range of motion and neck supple.     Right lower leg: No edema.     Left lower leg: No edema.  Lymphadenopathy:  Head:     Right side of head: No submental, submandibular, tonsillar, preauricular or posterior auricular adenopathy.     Left side of head: No submental, submandibular, tonsillar, preauricular or posterior auricular adenopathy.  Skin:    General: Skin is warm and dry.  Neurological:     Mental Status: She is alert and oriented to person, place, and time.  Psychiatric:        Attention and Perception: Attention normal.        Mood and Affect: Mood normal.        Behavior: Behavior normal. Behavior is cooperative.        Thought Content: Thought content normal.        Judgment: Judgment normal.     Results for orders placed or performed in visit on 08/11/19  CBC with Differential/Platelet  Result Value Ref Range   WBC 4.0 3.4 - 10.8 x10E3/uL   RBC 4.95 3.77 -  5.28 x10E6/uL   Hemoglobin 15.3 11.1 - 15.9 g/dL   Hematocrit 50.1 58.6 - 46.6 %   MCV 92 79 - 97 fL   MCH 30.9 26.6 - 33.0 pg   MCHC 33.7 31 - 35 g/dL   RDW 82.5 74.9 - 35.5 %   Platelets 217 150 - 450 x10E3/uL   Neutrophils 61 Not Estab. %   Lymphs 28 Not Estab. %   Monocytes 7 Not Estab. %   Eos 3 Not Estab. %   Basos 1 Not Estab. %   Neutrophils Absolute 2.5 1 - 7 x10E3/uL   Lymphocytes Absolute 1.1 0 - 3 x10E3/uL   Monocytes Absolute 0.3 0 - 0 x10E3/uL   EOS (ABSOLUTE) 0.1 0.0 - 0.4 x10E3/uL   Basophils Absolute 0.0 0 - 0 x10E3/uL   Immature Granulocytes 0 Not Estab. %   Immature Grans (Abs) 0.0 0.0 - 0.1 x10E3/uL  Comprehensive metabolic panel  Result Value Ref Range   Glucose 74 65 - 99 mg/dL   BUN 11 8 - 27 mg/dL   Creatinine, Ser 2.17 0.57 - 1.00 mg/dL   GFR calc non Af Amer 66 >59 mL/min/1.73   GFR calc Af Amer 77 >59 mL/min/1.73   BUN/Creatinine Ratio 12 12 - 28   Sodium 143 134 - 144 mmol/L   Potassium 3.8 3.5 - 5.2 mmol/L   Chloride 105 96 - 106 mmol/L   CO2 26 20 - 29 mmol/L   Calcium 8.9 8.7 - 10.3 mg/dL   Total Protein 6.2 6.0 - 8.5 g/dL   Albumin 4.6 3.8 - 4.8 g/dL   Globulin, Total 1.6 1.5 - 4.5 g/dL   Albumin/Globulin Ratio 2.9 (H) 1.2 - 2.2   Bilirubin Total 0.5 0.0 - 1.2 mg/dL   Alkaline Phosphatase 65 39 - 117 IU/L   AST 21 0 - 40 IU/L   ALT 13 0 - 32 IU/L  Lipid Panel w/o Chol/HDL Ratio  Result Value Ref Range   Cholesterol, Total 202 (H) 100 - 199 mg/dL   Triglycerides 70 0 - 149 mg/dL   HDL 78 >47 mg/dL   VLDL Cholesterol Cal 13 5 - 40 mg/dL   LDL Chol Calc (NIH) 159 (H) 0 - 99 mg/dL  VITAMIN D 25 Hydroxy (Vit-D Deficiency, Fractures)  Result Value Ref Range   Vit D, 25-Hydroxy 44.0 30.0 - 100.0 ng/mL  Thyroid Panel With TSH  Result Value Ref Range   TSH 2.490 0.450 - 4.500 uIU/mL   T4, Total 7.9 4.5 - 12.0 ug/dL   T3 Uptake  Ratio 26 24 - 39 %   Free Thyroxine Index 2.1 1.2 - 4.9  Cytology - PAP  Result Value Ref Range   High risk  HPV Negative    Adequacy      Satisfactory for evaluation. The presence or absence of an   Adequacy      endocervical/transformation zone component cannot be determined because   Adequacy of atrophy.    Diagnosis      - Negative for intraepithelial lesion or malignancy (NILM)   Comment Normal Reference Range HPV - Negative       Assessment & Plan:   Problem List Items Addressed This Visit      Endocrine   Hypothyroid - Primary    Chronic, ongoing.  Continue current medication regimen and adjust as needed.  Thyroid panel today.      Relevant Orders   T4, free   TSH       Follow up plan: Return in about 6 months (around 08/30/2020) for Annual physical.

## 2020-03-01 NOTE — Assessment & Plan Note (Signed)
Chronic, ongoing.  Continue current medication regimen and adjust as needed.  Thyroid panel today. 

## 2020-03-01 NOTE — Patient Instructions (Signed)
Hypothyroidism  Hypothyroidism is when the thyroid gland does not make enough of certain hormones (it is underactive). The thyroid gland is a small gland located in the lower front part of the neck, just in front of the windpipe (trachea). This gland makes hormones that help control how the body uses food for energy (metabolism) as well as how the heart and brain function. These hormones also play a role in keeping your bones strong. When the thyroid is underactive, it produces too little of the hormones thyroxine (T4) and triiodothyronine (T3). What are the causes? This condition may be caused by:  Hashimoto's disease. This is a disease in which the body's disease-fighting system (immune system) attacks the thyroid gland. This is the most common cause.  Viral infections.  Pregnancy.  Certain medicines.  Birth defects.  Past radiation treatments to the head or neck for cancer.  Past treatment with radioactive iodine.  Past exposure to radiation in the environment.  Past surgical removal of part or all of the thyroid.  Problems with a gland in the center of the brain (pituitary gland).  Lack of enough iodine in the diet. What increases the risk? You are more likely to develop this condition if:  You are female.  You have a family history of thyroid conditions.  You use a medicine called lithium.  You take medicines that affect the immune system (immunosuppressants). What are the signs or symptoms? Symptoms of this condition include:  Feeling as though you have no energy (lethargy).  Not being able to tolerate cold.  Weight gain that is not explained by a change in diet or exercise habits.  Lack of appetite.  Dry skin.  Coarse hair.  Menstrual irregularity.  Slowing of thought processes.  Constipation.  Sadness or depression. How is this diagnosed? This condition may be diagnosed based on:  Your symptoms, your medical history, and a physical exam.  Blood  tests. You may also have imaging tests, such as an ultrasound or MRI. How is this treated? This condition is treated with medicine that replaces the thyroid hormones that your body does not make. After you begin treatment, it may take several weeks for symptoms to go away. Follow these instructions at home:  Take over-the-counter and prescription medicines only as told by your health care provider.  If you start taking any new medicines, tell your health care provider.  Keep all follow-up visits as told by your health care provider. This is important. ? As your condition improves, your dosage of thyroid hormone medicine may change. ? You will need to have blood tests regularly so that your health care provider can monitor your condition. Contact a health care provider if:  Your symptoms do not get better with treatment.  You are taking thyroid replacement medicine and you: ? Sweat a lot. ? Have tremors. ? Feel anxious. ? Lose weight rapidly. ? Cannot tolerate heat. ? Have emotional swings. ? Have diarrhea. ? Feel weak. Get help right away if you have:  Chest pain.  An irregular heartbeat.  A rapid heartbeat.  Difficulty breathing. Summary  Hypothyroidism is when the thyroid gland does not make enough of certain hormones (it is underactive).  When the thyroid is underactive, it produces too little of the hormones thyroxine (T4) and triiodothyronine (T3).  The most common cause is Hashimoto's disease, a disease in which the body's disease-fighting system (immune system) attacks the thyroid gland. The condition can also be caused by viral infections, medicine, pregnancy, or past   radiation treatment to the head or neck.  Symptoms may include weight gain, dry skin, constipation, feeling as though you do not have energy, and not being able to tolerate cold.  This condition is treated with medicine to replace the thyroid hormones that your body does not make. This information  is not intended to replace advice given to you by your health care provider. Make sure you discuss any questions you have with your health care provider. Document Revised: 04/25/2017 Document Reviewed: 04/23/2017 Elsevier Patient Education  2020 [employer]  

## 2020-03-02 LAB — T4, FREE: Free T4: 1.56 ng/dL (ref 0.82–1.77)

## 2020-03-02 LAB — TSH: TSH: 1.41 u[IU]/mL (ref 0.450–4.500)

## 2020-03-02 NOTE — Progress Notes (Signed)
Contacted via MyChart  Good morning Ashley Lawrence, your thyroid labs have returned and both TSH and Free T4 are in good ranges.  I recommend continuing current medication dose and if any increased symptoms return to office.  Have a great day!! Keep being awesome!!  Thank you for allowing me to participate in your care. Kindest regards, Ryatt Corsino

## 2020-05-01 ENCOUNTER — Other Ambulatory Visit: Payer: Self-pay | Admitting: Nurse Practitioner

## 2020-05-01 DIAGNOSIS — Z1231 Encounter for screening mammogram for malignant neoplasm of breast: Secondary | ICD-10-CM

## 2020-05-11 ENCOUNTER — Telehealth: Payer: Self-pay

## 2020-05-11 NOTE — Telephone Encounter (Signed)
PT calling to f/up on her request / please advise  

## 2020-05-11 NOTE — Telephone Encounter (Signed)
Copied from CRM [id number]. Topic: General - Other >> May 11, 2020  9:27 AM Lyn Hollingshead D wrote: PT requesting a call back from Wilhemena Durie in regards her Medical Exception paperwork do date tomorrow / please advise  Patient wants medical exmption form uploaded to her Earleen Reaper and a callback from [place] once its uploaded.

## 2020-05-11 NOTE — Telephone Encounter (Signed)
Called and LVM for patient letting her know that I have re-faxed and emailed the documents to her as requested in her mychart message. Replied back to patient's mychart message but has not been viewed by the patient yet. Asked for patient to call back if she has any questions or did not receive the forms.

## 2020-05-31 ENCOUNTER — Other Ambulatory Visit: Payer: Self-pay

## 2020-05-31 ENCOUNTER — Ambulatory Visit
Admission: RE | Admit: 2020-05-31 | Discharge: 2020-05-31 | Disposition: A | Discharge status: Home / Self Care | Payer: No Typology Code available for payment source | Source: Ambulatory Visit | Attending: Nurse Practitioner | Admitting: Nurse Practitioner

## 2020-05-31 DIAGNOSIS — Z1231 Encounter for screening mammogram for malignant neoplasm of breast: Secondary | ICD-10-CM | POA: Diagnosis not present

## 2020-06-13 ENCOUNTER — Telehealth: Payer: Self-pay

## 2020-06-13 NOTE — Telephone Encounter (Signed)
Lvm to schedule apt from after hour nurse triage note apt virtual due to SX.

## 2020-06-15 NOTE — Telephone Encounter (Signed)
Pt reports she is a little better.  Pt declined to make virtual appt.  Pt says the nurse advised her there is not much that can be done, it just has to run its course. Pt agreed to call back if she gets worse

## 2020-08-12 ENCOUNTER — Encounter: Payer: Self-pay | Admitting: Nurse Practitioner

## 2020-08-16 ENCOUNTER — Other Ambulatory Visit: Payer: Self-pay

## 2020-08-16 ENCOUNTER — Encounter: Payer: Self-pay | Admitting: Nurse Practitioner

## 2020-08-16 ENCOUNTER — Ambulatory Visit (INDEPENDENT_AMBULATORY_CARE_PROVIDER_SITE_OTHER): Payer: No Typology Code available for payment source | Admitting: Nurse Practitioner

## 2020-08-16 VITALS — BP 92/61 | HR 75 | Temp 98.2°F | Ht 63.0 in | Wt 134.4 lb

## 2020-08-16 DIAGNOSIS — M5432 Sciatica, left side: Secondary | ICD-10-CM

## 2020-08-16 DIAGNOSIS — M85851 Other specified disorders of bone density and structure, right thigh: Secondary | ICD-10-CM

## 2020-08-16 DIAGNOSIS — G8929 Other chronic pain: Secondary | ICD-10-CM

## 2020-08-16 DIAGNOSIS — Z1211 Encounter for screening for malignant neoplasm of colon: Secondary | ICD-10-CM | POA: Diagnosis not present

## 2020-08-16 DIAGNOSIS — M25511 Pain in right shoulder: Secondary | ICD-10-CM

## 2020-08-16 DIAGNOSIS — Z1159 Encounter for screening for other viral diseases: Secondary | ICD-10-CM

## 2020-08-16 DIAGNOSIS — Z114 Encounter for screening for human immunodeficiency virus [HIV]: Secondary | ICD-10-CM

## 2020-08-16 DIAGNOSIS — Z0000 Encounter for general adult medical examination without abnormal findings: Secondary | ICD-10-CM | POA: Diagnosis not present

## 2020-08-16 DIAGNOSIS — E7800 Pure hypercholesterolemia, unspecified: Secondary | ICD-10-CM

## 2020-08-16 DIAGNOSIS — E039 Hypothyroidism, unspecified: Secondary | ICD-10-CM

## 2020-08-16 MED ORDER — MELOXICAM 7.5 MG PO TABS: 7.5000 mg | ORAL_TABLET | Freq: Every day | ORAL | 4 refills | Status: DC

## 2020-08-16 MED ORDER — LEVOTHYROXINE SODIUM 50 MCG PO TABS: 50.0000 ug | ORAL_TABLET | Freq: Every day | ORAL | 4 refills | Status: DC

## 2020-08-16 NOTE — Assessment & Plan Note (Signed)
Noted on past labs, will plan to recheck fasting labs in morning, is not fasting today.  Recommend continued heavy focus on diet.

## 2020-08-16 NOTE — Assessment & Plan Note (Signed)
Chronic, ongoing.  Continue current medication regimen and adjust as needed.  Thyroid labs today. 

## 2020-08-16 NOTE — Assessment & Plan Note (Signed)
Noted on DEXA in 2020.  Continue on calcium and Vit D supplement daily.  Repeat DEXA in 2025.  Check Vit D level on labs.

## 2020-08-16 NOTE — Assessment & Plan Note (Signed)
Chronic, ongoing for 2 years.  Will obtain imaging, suspect some arthritic changes with intermittent impingement.  At this time she declines PT or Prednisone.  Will send in Meloxicam to use only as needed, recommend she not use daily and discussed risks vs benefits of medication.  Could consider shoulder steroid injection in office in future if ongoing pain.  Return to office as needed for worsening or ongoing pain.

## 2020-08-16 NOTE — Assessment & Plan Note (Signed)
Chronic, ongoing with intermittent flares.  At this time she declines PT or Prednisone.  Will send in Meloxicam to use only as needed, recommend she not use daily and discussed risks vs benefits of medication.  Recommend daily stretches and use of heat as needed + may try OTC Icy/Hot or Voltaren gel.  Return to office as needed for worsening or ongoing pain.

## 2020-08-16 NOTE — Patient Instructions (Signed)
Healthy Eating Following a healthy eating pattern may help you to achieve and maintain a healthy body weight, reduce the risk of chronic disease, and live a long and productive life. It is important to follow a healthy eating pattern at an appropriate calorie level for your body. Your nutritional needs should be met primarily through food by choosing a variety of nutrient-rich foods. What are tips for following this plan? Reading food labels  Read labels and choose the following: ? Reduced or low sodium. ? Juices with 100% fruit juice. ? Foods with low saturated fats and high polyunsaturated and monounsaturated fats. ? Foods with whole grains, such as whole wheat, cracked wheat, brown rice, and wild rice. ? Whole grains that are fortified with folic acid. This is recommended for women who are pregnant or who want to become pregnant.  Read labels and avoid the following: ? Foods with a lot of added sugars. These include foods that contain brown sugar, corn sweetener, corn syrup, dextrose, fructose, glucose, high-fructose corn syrup, honey, invert sugar, lactose, malt syrup, maltose, molasses, raw sugar, sucrose, trehalose, or turbinado sugar.  Do not eat more than the following amounts of added sugar per day:  6 teaspoons (25 g) for women.  9 teaspoons (38 g) for men. ? Foods that contain processed or refined starches and grains. ? Refined grain products, such as white flour, degermed cornmeal, white bread, and white rice. Shopping  Choose nutrient-rich snacks, such as vegetables, whole fruits, and nuts. Avoid high-calorie and high-sugar snacks, such as potato chips, fruit snacks, and candy.  Use oil-based dressings and spreads on foods instead of solid fats such as butter, stick margarine, or cream cheese.  Limit pre-made sauces, mixes, and "instant" products such as flavored rice, instant noodles, and ready-made pasta.  Try more plant-protein sources, such as tofu, tempeh, black beans,  edamame, lentils, nuts, and seeds.  Explore eating plans such as the Mediterranean diet or vegetarian diet. Cooking  Use oil to saut or stir-fry foods instead of solid fats such as butter, stick margarine, or lard.  Try baking, boiling, grilling, or broiling instead of frying.  Remove the fatty part of meats before cooking.  Steam vegetables in water or broth. Meal planning  At meals, imagine dividing your plate into fourths: ? One-half of your plate is fruits and vegetables. ? One-fourth of your plate is whole grains. ? One-fourth of your plate is protein, especially lean meats, poultry, eggs, tofu, beans, or nuts.  Include low-fat dairy as part of your daily diet.   Lifestyle  Choose healthy options in all settings, including home, work, school, restaurants, or stores.  Prepare your food safely: ? Wash your hands after handling raw meats. ? Keep food preparation surfaces clean by regularly washing with hot, soapy water. ? Keep raw meats separate from ready-to-eat foods, such as fruits and vegetables. ? Cook seafood, meat, poultry, and eggs to the recommended internal temperature. ? Store foods at safe temperatures. In general:  Keep cold foods at 7F (4.4C) or below.  Keep hot foods at 17F (60C) or above.  Keep your freezer at [facility] (-17.8C) or below.  Foods are no longer safe to eat when they have been between the temperatures of 40-17F (4.4-60C) for more than 2 hours. What foods should I eat? Fruits Aim to eat 2 cup-equivalents of fresh, canned (in natural juice), or frozen fruits each day. Examples of 1 cup-equivalent of fruit include 1 small apple, 8 large strawberries, 1 cup canned fruit,  cup dried fruit, or 1 cup 100% juice. Vegetables Aim to eat 2-3 cup-equivalents of fresh and frozen vegetables each day, including different varieties and colors. Examples of 1 cup-equivalent of vegetables include 2 medium carrots, 2 cups raw, leafy greens, 1 cup chopped  vegetable (raw or cooked), or 1 medium baked potato. Grains Aim to eat 6 ounce-equivalents of whole grains each day. Examples of 1 ounce-equivalent of grains include 1 slice of bread, 1 cup ready-to-eat cereal, 3 cups popcorn, or  cup cooked rice, pasta, or cereal. Meats and other proteins Aim to eat 5-6 ounce-equivalents of protein each day. Examples of 1 ounce-equivalent of protein include 1 egg, 1/2 cup nuts or seeds, or 1 tablespoon (16 g) peanut butter. A cut of meat or fish that is the size of a deck of cards is about 3-4 ounce-equivalents.  Of the protein you eat each week, try to have at least 8 ounces come from seafood. This includes salmon, trout, herring, and anchovies. Dairy Aim to eat 3 cup-equivalents of fat-free or low-fat dairy each day. Examples of 1 cup-equivalent of dairy include 1 cup (240 mL) milk, 8 ounces (250 g) yogurt, 1 ounces (44 g) natural cheese, or 1 cup (240 mL) fortified soy milk. Fats and oils  Aim for about 5 teaspoons (21 g) per day. Choose monounsaturated fats, such as canola and olive oils, avocados, peanut butter, and most nuts, or polyunsaturated fats, such as sunflower, corn, and soybean oils, walnuts, pine nuts, sesame seeds, sunflower seeds, and flaxseed. Beverages  Aim for six 8-oz glasses of water per day. Limit coffee to three to five 8-oz cups per day.  Limit caffeinated beverages that have added calories, such as soda and energy drinks.  Limit alcohol intake to no more than 1 drink a day for nonpregnant women and 2 drinks a day for men. One drink equals 12 oz of beer (355 mL), 5 oz of wine (148 mL), or 1 oz of hard liquor (44 mL). Seasoning and other foods  Avoid adding excess amounts of salt to your foods. Try flavoring foods with herbs and spices instead of salt.  Avoid adding sugar to foods.  Try using oil-based dressings, sauces, and spreads instead of solid fats. This information is based on general [place] nutrition guidelines. For more  information, visit choosemyplate.gov. Exact amounts may vary based on your nutrition needs. Summary  A healthy eating plan may help you to maintain a healthy weight, reduce the risk of chronic diseases, and stay active throughout your life.  Plan your meals. Make sure you eat the right portions of a variety of nutrient-rich foods.  Try baking, boiling, grilling, or broiling instead of frying.  Choose healthy options in all settings, including home, work, school, restaurants, or stores. This information is not intended to replace advice given to you by your health care provider. Make sure you discuss any questions you have with your health care provider. Document Revised: 08/25/2017 Document Reviewed: 08/25/2017 Elsevier Patient Education  2021 Elsevier Inc.  

## 2020-08-16 NOTE — Progress Notes (Signed)
BP 92/61   Pulse 75   Temp 98.2 F (36.8 C) (Oral)   Ht 5\' 3"  (1.6 m)   Wt 134 lb 6.4 oz (61 kg)   SpO2 97%   BMI 23.81 kg/m    Subjective:    Patient ID: , female    DOB: [birth year], 65 y.o.   MRN: [id number]  HPI: Ashley Lawrence is a 65 y.o. female presenting on 08/16/2020 for comprehensive medical examination. Current medical complaints include:none  She currently lives with: husband Menopausal Symptoms: no   HYPOTHYROIDISM Is taking Levothyroxine 50 MCG, diagnosed 5 years ago.  Did have osteopenia noted on DEXA on DEXA 12/30/2018 and continues supplements.  The BMD measured at Femur Neck Right is 0.733 g/cm2 with a T-score of -2.2. Thyroid control status:stable Satisfied with current treatment? yes Medication side effects: no Medication compliance: good compliance Etiology of hypothyroidism:  Recent dose adjustment:no Fatigue: no Cold intolerance: yes -- at baseline Heat intolerance: no Weight gain: no Weight loss: no Constipation: no Diarrhea/loose stools: no Palpitations: no Lower extremity edema: no Anxiety/depressed mood: no  SHOULDER PAIN Has had right shoulder pain for two years -- on and off from overworking and doing stuff.  Also reports some sciatic discomfort on and off to left lower back.  Has used husband's Meloxicam which offers benefit. Duration: chronic Involved shoulder: right Mechanism of injury: unknown Location: anterior Onset:gradual Severity: 8/10 at worst and 0/10 at best Quality:  sharp Frequency: intermittent Radiation: no Aggravating factors: lifting, movement and throwing  Alleviating factors: NSAIDs and rest  Status: fluctuating Treatments attempted: ibuprofen and aleve  Relief with NSAIDs?:  moderate Weakness: no Numbness: no Decreased grip strength: no Redness: no Swelling: no Bruising: no Fevers: no  Depression Screen done today and results listed below:  Depression screen [facility] 2/9 08/16/2020 08/11/2019  Decreased  Interest 0 0  Down, Depressed, Hopeless 0 0  PHQ - 2 Score 0 0  Altered sleeping 0 -  Tired, decreased energy 0 -  Change in appetite 0 -  Feeling bad or failure about yourself  0 -  Trouble concentrating 0 -  Moving slowly or fidgety/restless 0 -  Suicidal thoughts 0 -  PHQ-9 Score 0 -    The patient does not have a history of falls. I did not complete a risk assessment for falls. A plan of care for falls was not documented.   Past Medical History:  History reviewed. No pertinent past medical history.  Surgical History:  Past Surgical History:  Procedure Laterality Date  . BREAST BIOPSY Left    neg  . CHOLECYSTECTOMY    . NM RENAL LASIX ([facility] HX)    . TUBAL LIGATION      Medications:  Current Outpatient Medications on File Prior to Visit  Medication Sig  . calcium carbonate (OSCAL) 1500 (600 Ca) MG TABS tablet Take 600 mg by mouth 2 (two) times daily with a meal.  . cholecalciferol (VITAMIN D3) 25 MCG (1000 UNIT) tablet Take 4,000 Units by mouth daily.  . Multiple Vitamin (MULTIVITAMIN) tablet Take 1 tablet by mouth daily.   No current facility-administered medications on file prior to visit.    Allergies:  No Known Allergies  Social History:  Social History   Socioeconomic History  . Marital status: Unknown    Spouse name: Not on file  . Number of children: Not on file  . Years of education: Not on file  . Highest education level: Not on file  Occupational History  Employer: [employer]    Comment: Print production planner  Tobacco Use  . Smoking status: Never Smoker  . Smokeless tobacco: Never Used  Vaping Use  . Vaping Use: Never used  Substance and Sexual Activity  . Alcohol use: Never  . Drug use: Never  . Sexual activity: Yes  Other Topics Concern  . Not on file  Social History Narrative  . Not on file   Social Determinants of Health   Financial Resource Strain: Not on file  Food Insecurity: Not on file  Transportation Needs: Not on file  Physical  Activity: Not on file  Stress: Not on file  Social Connections: Not on file  Intimate Partner Violence: Not on file   Social History   Tobacco Use  Smoking Status Never Smoker  Smokeless Tobacco Never Used   Social History   Substance and Sexual Activity  Alcohol Use Never    Family History:  Family History  Problem Relation Age of Onset  . Breast cancer Neg Hx     Past medical history, surgical history, medications, allergies, family history and social history reviewed with patient today and changes made to appropriate areas of the chart.   Review of Systems - negative All other ROS negative except what is listed above and in the HPI.      Objective:    BP 92/61   Pulse 75   Temp 98.2 F (36.8 C) (Oral)   Ht 5\' 3"  (1.6 m)   Wt 134 lb 6.4 oz (61 kg)   SpO2 97%   BMI 23.81 kg/m   Wt Readings from Last 3 Encounters:  08/16/20 134 lb 6.4 oz (61 kg)  03/01/20 131 lb 12.8 oz (59.8 kg)  08/11/19 132 lb 3.2 oz (60 kg)    Physical Exam Constitutional:      General: She is awake. She is not in acute distress.    Appearance: She is well-developed. She is not ill-appearing.  HENT:     Head: Normocephalic and atraumatic.     Right Ear: Hearing, tympanic membrane, ear canal and external ear normal. No drainage.     Left Ear: Hearing, tympanic membrane, ear canal and external ear normal. No drainage.     Nose: Nose normal.     Right Sinus: No maxillary sinus tenderness or frontal sinus tenderness.     Left Sinus: No maxillary sinus tenderness or frontal sinus tenderness.     Mouth/Throat:     Mouth: Mucous membranes are moist.     Pharynx: Oropharynx is clear. Uvula midline. No pharyngeal swelling, oropharyngeal exudate or posterior oropharyngeal erythema.  Eyes:     General: Lids are normal.        Right eye: No discharge.        Left eye: No discharge.     Extraocular Movements: Extraocular movements intact.     Conjunctiva/sclera: Conjunctivae normal.     Pupils:  Pupils are equal, round, and reactive to light.     Visual Fields: Right eye visual fields normal and left eye visual fields normal.  Neck:     Thyroid: No thyromegaly.     Vascular: No carotid bruit.     Trachea: Trachea normal.  Cardiovascular:     Rate and Rhythm: Normal rate and regular rhythm.     Heart sounds: Normal heart sounds. No murmur heard. No gallop.   Pulmonary:     Effort: Pulmonary effort is normal. No accessory muscle usage or respiratory distress.  Breath sounds: Normal breath sounds.  Chest:  Breasts:     Right: Normal. No axillary adenopathy or supraclavicular adenopathy.     Left: Normal. No axillary adenopathy or supraclavicular adenopathy.      Comments: Dense breast tissue. Abdominal:     General: Bowel sounds are normal.     Palpations: Abdomen is soft. There is no hepatomegaly or splenomegaly.     Tenderness: There is no abdominal tenderness.  Musculoskeletal:        General: Normal range of motion.     Right shoulder: Tenderness (to anterior lateral aspect) present. No swelling, effusion, laceration, bony tenderness or crepitus. Normal range of motion. Normal strength. Normal pulse.     Left shoulder: Normal.     Cervical back: Normal range of motion and neck supple.     Lumbar back: Normal.     Right lower leg: No edema.     Left lower leg: No edema.     Comments: Some discomfort reported with lateral movement of right shoulder.  Lymphadenopathy:     Head:     Right side of head: No submental, submandibular, tonsillar, preauricular or posterior auricular adenopathy.     Left side of head: No submental, submandibular, tonsillar, preauricular or posterior auricular adenopathy.     Cervical: No cervical adenopathy.     Upper Body:     Right upper body: No supraclavicular, axillary or pectoral adenopathy.     Left upper body: No supraclavicular, axillary or pectoral adenopathy.  Skin:    General: Skin is warm and dry.     Capillary Refill:  Capillary refill takes less than 2 seconds.     Findings: No rash.  Neurological:     Mental Status: She is alert and oriented to person, place, and time.     Cranial Nerves: Cranial nerves are intact.     Gait: Gait is intact.     Deep Tendon Reflexes: Reflexes are normal and symmetric.     Reflex Scores:      Brachioradialis reflexes are 2+ on the right side and 2+ on the left side.      Patellar reflexes are 2+ on the right side and 2+ on the left side. Psychiatric:        Attention and Perception: Attention normal.        Mood and Affect: Mood normal.        Speech: Speech normal.        Behavior: Behavior normal. Behavior is cooperative.        Thought Content: Thought content normal.        Judgment: Judgment normal.    Results for orders placed or performed in visit on 03/01/20  T4, free  Result Value Ref Range   Free T4 1.56 0.82 - 1.77 ng/dL  TSH  Result Value Ref Range   TSH 1.410 0.450 - 4.500 uIU/mL      Assessment & Plan:   Problem List Items Addressed This Visit      Endocrine   Hypothyroid - Primary    Chronic, ongoing.  Continue current medication regimen and adjust as needed.  Thyroid labs today.      Relevant Medications   levothyroxine (SYNTHROID) 50 MCG tablet   Other Relevant Orders   T4, free   TSH     Nervous and Auditory   Sciatic pain, left    Chronic, ongoing with intermittent flares.  At this time she declines PT or Prednisone.  Will send  in Meloxicam to use only as needed, recommend she not use daily and discussed risks vs benefits of medication.  Recommend daily stretches and use of heat as needed + may try OTC Icy/Hot or Voltaren gel.  Return to office as needed for worsening or ongoing pain.        Musculoskeletal and Integument   Osteopenia of neck of right femur    Noted on DEXA in 2020.  Continue on calcium and Vit D supplement daily.  Repeat DEXA in 2025.  Check Vit D level on labs.      Relevant Orders   VITAMIN D 25 Hydroxy  (Vit-D Deficiency, Fractures)     Other   Elevated LDL cholesterol level    Noted on past labs, will plan to recheck fasting labs in morning, is not fasting today.  Recommend continued heavy focus on diet.      Relevant Orders   Lipid Panel w/o Chol/HDL Ratio   Comprehensive metabolic panel   Chronic right shoulder pain    Chronic, ongoing for 2 years.  Will obtain imaging, suspect some arthritic changes with intermittent impingement.  At this time she declines PT or Prednisone.  Will send in Meloxicam to use only as needed, recommend she not use daily and discussed risks vs benefits of medication.  Could consider shoulder steroid injection in office in future if ongoing pain.  Return to office as needed for worsening or ongoing pain.      Relevant Medications   meloxicam (MOBIC) 7.5 MG tablet   Other Relevant Orders   DG Shoulder Right    Other Visit Diagnoses    Colon cancer screening       Cologuard ordered for screening.   Relevant Orders   Cologuard   Encounter for screening for HIV       HIV screening done, x 1 time per guideline recommendations.   Relevant Orders   HIV Antibody (routine testing w rflx)   Need for hepatitis C screening test       Hep C screening done, x 1 time per guideline recommendations.   Relevant Orders   Hepatitis C antibody   Encounter for annual physical exam       Annual labs today to include CBC, CMP, TSH, Lipid.  Health maintenance reviewed, refuses vaccinations.   Relevant Orders   CBC with Differential/Platelet       Follow up plan: Return in about 1 year (around 08/16/2021) for Annual physical.   LABORATORY TESTING:  - Pap smear: Up To Date, done 08/11/2019  IMMUNIZATIONS:   - Tdap: Tetanus vaccination status reviewed: awaiting past records to review. - Influenza: Refused - Pneumovax: Not applicable - Prevnar: Not applicable - HPV: Not applicable - Zostavax vaccine: Refused  SCREENING: -Mammogram: Up To Date -- 05/31/2020 -  Colonoscopy: awaiting past records, thinks she did Cologuard last year  - Bone Density: due in 2025  -Hearing Test: Not applicable  -Spirometry: Not applicable   PATIENT COUNSELING:   Advised to take 1 mg of folate supplement per day if capable of pregnancy.   Sexuality: Discussed sexually transmitted diseases, partner selection, use of condoms, avoidance of unintended pregnancy  and contraceptive alternatives.   Advised to avoid cigarette smoking.  I discussed with the patient that most people either abstain from alcohol or drink within safe limits (<=14/week and <=4 drinks/occasion for males, <=7/weeks and <= 3 drinks/occasion for females) and that the risk for alcohol disorders and other health effects rises proportionally with the  number of drinks per week and how often a drinker exceeds daily limits.  Discussed cessation/primary prevention of drug use and availability of treatment for abuse.   Diet: Encouraged to adjust caloric intake to maintain  or achieve ideal body weight, to reduce intake of dietary saturated fat and total fat, to limit sodium intake by avoiding high sodium foods and not adding table salt, and to maintain adequate dietary potassium and calcium preferably from fresh fruits, vegetables, and low-fat dairy products.    Stressed the importance of regular exercise  Injury prevention: Discussed safety belts, safety helmets, smoke detector, smoking near bedding or upholstery.   Dental health: Discussed importance of regular tooth brushing, flossing, and dental visits.    NEXT PREVENTATIVE PHYSICAL DUE IN 1 YEAR. Return in about 1 year (around 08/16/2021) for Annual physical.

## 2020-08-17 ENCOUNTER — Other Ambulatory Visit: Payer: No Typology Code available for payment source

## 2020-08-17 DIAGNOSIS — Z114 Encounter for screening for human immunodeficiency virus [HIV]: Secondary | ICD-10-CM

## 2020-08-17 DIAGNOSIS — M85851 Other specified disorders of bone density and structure, right thigh: Secondary | ICD-10-CM

## 2020-08-17 DIAGNOSIS — Z1159 Encounter for screening for other viral diseases: Secondary | ICD-10-CM

## 2020-08-17 DIAGNOSIS — E7800 Pure hypercholesterolemia, unspecified: Secondary | ICD-10-CM

## 2020-08-17 DIAGNOSIS — Z0000 Encounter for general adult medical examination without abnormal findings: Secondary | ICD-10-CM

## 2020-08-17 DIAGNOSIS — E039 Hypothyroidism, unspecified: Secondary | ICD-10-CM

## 2020-08-18 LAB — CBC WITH DIFFERENTIAL/PLATELET
Basophils Absolute: 0 10*3/uL (ref 0.0–0.2)
Basos: 1 %
EOS (ABSOLUTE): 0.1 10*3/uL (ref 0.0–0.4)
Eos: 3 %
Hematocrit: 42.2 % (ref 34.0–46.6)
Hemoglobin: 14.2 g/dL (ref 11.1–15.9)
Immature Grans (Abs): 0 10*3/uL (ref 0.0–0.1)
Immature Granulocytes: 0 %
Lymphocytes Absolute: 1.2 10*3/uL (ref 0.7–3.1)
Lymphs: 32 %
MCH: 30.6 pg (ref 26.6–33.0)
MCHC: 33.6 g/dL (ref 31.5–35.7)
MCV: 91 fL (ref 79–97)
Monocytes Absolute: 0.3 10*3/uL (ref 0.1–0.9)
Monocytes: 9 %
Neutrophils Absolute: 2 10*3/uL (ref 1.4–7.0)
Neutrophils: 55 %
Platelets: 225 10*3/uL (ref 150–450)
RBC: 4.64 x10E6/uL (ref 3.77–5.28)
RDW: 13.3 % (ref 11.7–15.4)
WBC: 3.6 10*3/uL (ref 3.4–10.8)

## 2020-08-18 LAB — COMPREHENSIVE METABOLIC PANEL
ALT: 19 IU/L (ref 0–32)
AST: 22 IU/L (ref 0–40)
Albumin/Globulin Ratio: 2.4 — ABNORMAL HIGH (ref 1.2–2.2)
Albumin: 4.5 g/dL (ref 3.8–4.8)
Alkaline Phosphatase: 71 IU/L (ref 44–121)
BUN/Creatinine Ratio: 15 (ref 12–28)
BUN: 15 mg/dL (ref 8–27)
Bilirubin Total: 0.7 mg/dL (ref 0.0–1.2)
CO2: 25 mmol/L (ref 20–29)
Calcium: 9.5 mg/dL (ref 8.7–10.3)
Chloride: 101 mmol/L (ref 96–106)
Creatinine, Ser: 1.02 mg/dL — ABNORMAL HIGH (ref 0.57–1.00)
Globulin, Total: 1.9 g/dL (ref 1.5–4.5)
Glucose: 82 mg/dL (ref 65–99)
Potassium: 4.1 mmol/L (ref 3.5–5.2)
Sodium: 143 mmol/L (ref 134–144)
Total Protein: 6.4 g/dL (ref 6.0–8.5)
eGFR: 61 mL/min/{1.73_m2} (ref >59)

## 2020-08-18 LAB — TSH: TSH: 2.46 u[IU]/mL (ref 0.450–4.500)

## 2020-08-18 LAB — VITAMIN D 25 HYDROXY (VIT D DEFICIENCY, FRACTURES): Vit D, 25-Hydroxy: 70.8 ng/mL (ref 30.0–100.0)

## 2020-08-18 LAB — LIPID PANEL W/O CHOL/HDL RATIO
Cholesterol, Total: 217 mg/dL — ABNORMAL HIGH (ref 100–199)
HDL: 66 mg/dL (ref >39)
LDL Chol Calc (NIH): 135 mg/dL — ABNORMAL HIGH (ref 0–99)
Triglycerides: 92 mg/dL (ref 0–149)
VLDL Cholesterol Cal: 16 mg/dL (ref 5–40)

## 2020-08-18 LAB — HEPATITIS C ANTIBODY: Hep C Virus Ab: <0.1 s/co ratio (ref 0.0–0.9)

## 2020-08-18 LAB — T4, FREE: Free T4: 1.36 ng/dL (ref 0.82–1.77)

## 2020-08-18 LAB — HIV ANTIBODY (ROUTINE TESTING W REFLEX): HIV Screen 4th Generation wRfx: NONREACTIVE

## 2020-08-18 NOTE — Progress Notes (Signed)
Contacted via MyChart The 10-year ASCVD risk score Denman George DC Jr., et al., 2013) is: 2.6%   Values used to calculate the score:     Age: 65 years     Sex: Female     Is Non-Hispanic African American: No     Diabetic: No     Tobacco smoker: No     Systolic Blood Pressure: 92 mmHg     Is BP treated: No     HDL Cholesterol: 66 mg/dL     Total Cholesterol: 217 mg/dL    Good afternoon Shaneil, your labs have returned and overall everything has returned normal.  Can continue current thyroid medications.  Your cholesterol is still high, but continued recommendations to make lifestyle changes. Your LDL is above normal. The LDL is the bad cholesterol. Over time and in combination with inflammation and other factors, this contributes to plaque which in turn may lead to stroke and/or heart attack down the road. Sometimes high LDL is primarily genetic, and people might be eating all the right foods but still have high numbers. Other times, there is room for improvement in one's diet and eating healthier can bring this number down and potentially reduce one's risk of heart attack and/or stroke.   To reduce your LDL, Remember - more fruits and vegetables, more fish, and limit red meat and dairy products. More soy, nuts, beans, barley, lentils, oats and plant sterol ester enriched margarine instead of butter. I also encourage eliminating sugar and processed food. Remember, shop on the outside of the grocery store and visit your [employer]. If you would like to talk with me about dietary changes plus or minus medications for your cholesterol, please let me know. We should recheck your cholesterol in 12 months.  Any questions?  Have an amazing weekend. Keep being awesome!!  Thank you for allowing me to participate in your care. Kindest regards, Gurkirat Basher

## 2020-08-21 ENCOUNTER — Ambulatory Visit
Admission: RE | Admit: 2020-08-21 | Discharge: 2020-08-21 | Disposition: A | Discharge status: Home / Self Care | Payer: No Typology Code available for payment source | Source: Ambulatory Visit | Attending: Nurse Practitioner | Admitting: Nurse Practitioner

## 2020-08-21 ENCOUNTER — Ambulatory Visit
Admission: RE | Admit: 2020-08-21 | Discharge: 2020-08-21 | Disposition: A | Discharge status: Home / Self Care | Payer: No Typology Code available for payment source | Attending: Nurse Practitioner | Admitting: Nurse Practitioner

## 2020-08-21 ENCOUNTER — Other Ambulatory Visit: Payer: Self-pay

## 2020-08-21 DIAGNOSIS — G8929 Other chronic pain: Secondary | ICD-10-CM

## 2020-08-21 DIAGNOSIS — M25511 Pain in right shoulder: Secondary | ICD-10-CM | POA: Diagnosis not present

## 2020-08-21 NOTE — Progress Notes (Signed)
Contacted via MyChart   Good afternoon Maudell, your shoulder imaging returned and everything on review of images and report is normal.  No arthritic changes.  If ongoing shoulder please return to office, we could consider injection of steroid into joint which sometimes helps for a period.  Any questions? Keep being awesome!!  Thank you for allowing me to participate in your care. Kindest regards, Corie Vavra

## 2020-08-27 LAB — COLOGUARD: Cologuard: NEGATIVE

## 2020-08-27 LAB — EXTERNAL GENERIC LAB PROCEDURE: COLOGUARD: NEGATIVE

## 2020-09-26 ENCOUNTER — Other Ambulatory Visit: Payer: Self-pay

## 2020-09-26 ENCOUNTER — Encounter: Payer: Self-pay | Admitting: Nurse Practitioner

## 2020-09-26 ENCOUNTER — Ambulatory Visit (INDEPENDENT_AMBULATORY_CARE_PROVIDER_SITE_OTHER): Payer: No Typology Code available for payment source | Admitting: Nurse Practitioner

## 2020-09-26 DIAGNOSIS — G8929 Other chronic pain: Secondary | ICD-10-CM | POA: Diagnosis not present

## 2020-09-26 DIAGNOSIS — M25511 Pain in right shoulder: Secondary | ICD-10-CM

## 2020-09-26 NOTE — Assessment & Plan Note (Signed)
Chronic, ongoing for 2 years.  Steroid injection today without issue.  At this time she declines PT or Prednisone.  Will continue Meloxicam to use only as needed, recommend she not use daily and discussed risks vs benefits of medication.  Return to office in 4 weeks.

## 2020-09-26 NOTE — Patient Instructions (Signed)
Joint Steroid Injection A joint steroid injection is a procedure to relieve swelling and pain in a joint. Steroids are medicines that reduce inflammation. In this procedure, your health care provider uses a syringe and a needle to inject a steroid medicine into a painful and inflamed joint. A pain-relieving medicine (anesthetic) may be injected along with the steroid. In some cases, your health care provider may use an imaging technique such as ultrasound or fluoroscopy to guide the injection. Joints that are often treated with steroid injections include the knee, shoulder, hip, and spine. These injections may also be used in the elbow, ankle, and joints of the hands or feet. You may have joint steroid injections as part of your treatment for inflammation caused by:  Gout.  Rheumatoid arthritis.  Advanced wear-and-tear arthritis (osteoarthritis).  Tendinitis.  Bursitis. Joint steroid injections may be repeated, but having them too often can damage a joint or the skin over the joint. You should not have joint steroid injections less than 6 weeks apart or more than four times a year. Tell a health care provider about:  Any allergies you have.  All medicines you are taking, including vitamins, herbs, eye drops, creams, and over-the-counter medicines.  Any problems you or family members have had with anesthetic medicines.  Any blood disorders you have.  Any surgeries you have had.  Any medical conditions you have.  Whether you are pregnant or may be pregnant. What are the risks? Generally, this is a safe treatment. However, problems may occur, including:  Infection.  Bleeding.  Allergic reactions to medicines.  Damage to the joint or tissues around the joint.  Thinning of skin or loss of skin color over the joint.  Temporary flushing of the face or chest.  Temporary increase in pain.  Temporary increase in blood sugar.  Failure to relieve inflammation or pain. What  happens before the treatment? Medicines Ask your health care provider about:  Changing or stopping your regular medicines. This is especially important if you are taking diabetes medicines or blood thinners.  Taking medicines such as aspirin and ibuprofen. These medicines can thin your blood. Do not take these medicines unless your health care provider tells you to take them.  Taking over-the-counter medicines, vitamins, herbs, and supplements. General instructions  You may have imaging tests of your joint.  Ask your health care provider if you can drive yourself home after the procedure. What happens during the treatment?  Your health care provider will position you for the injection and locate the injection site over your joint.  The skin over the joint will be cleaned with a germ-killing soap.  Your health care provider may: ? Spray a numbing solution (topical anesthetic) over the injection site. ? Inject a local anesthetic under the skin above your joint.  The needle will be placed through your skin into your joint. Your health care provider may use imaging to guide the needle to the right spot for the injection. If imaging is used, a special contrast dye may be injected to confirm that the needle is in the correct location.  The steroid medicine will be injected into your joint.  Anesthetic may be injected along with the steroid. This may be a medicine that relieves pain for a short time (short-acting anesthetic) or for a longer time (long-acting anesthetic).  The needle will be removed, and an adhesive bandage (dressing) will be placed over the injection site. The procedure may vary among health care providers and hospitals.     What can I expect after the treatment?  You will be able to go home after the treatment.  It is normal to feel slight flushing for a few days after the injection.  After the treatment, it is common to have an increase in joint pain after the  anesthetic has worn off. This may happen about an hour after a short-acting anesthetic or about 8 hours after a longer-acting anesthetic.  You should begin to feel relief from joint pain and swelling after 24 to 48 hours. Contact your health care provider if you do not begin to feel relief after 2 days. Follow these instructions at home: Injection site care  Leave the adhesive dressing over your injection site in place until your health care provider says you can remove it.  Check your injection site every day for signs of infection. Check for: ? More redness, swelling, or pain. ? Fluid or blood. ? Warmth. ? Pus or a bad smell. Activity  Return to your normal activities as told by your health care provider. Ask your health care provider what activities are safe for you. You may be asked to limit activities that put stress on the joint for a few days.  Do joint exercises as told by your health care provider.  Do not take baths, swim, or use a hot tub until your health care provider approves. Ask your health care provider if you may take showers. You may only be allowed to take sponge baths. Managing pain, stiffness, and swelling  If directed, put ice on the joint. To do this: ? Put ice in a plastic bag. ? Place a towel between your skin and the bag. ? Leave the ice on for 20 minutes, 2-3 times a day. ? Remove the ice if your skin turns bright red. This is very important. If you cannot feel pain, heat, or cold, you have a greater risk of damage to the area.  Raise (elevate) your joint above the level of your heart when you are sitting or lying down.   General instructions  Take over-the-counter and prescription medicines only as told by your health care provider.  Do not use any products that contain nicotine or tobacco, such as cigarettes, e-cigarettes, and chewing tobacco. These can delay joint healing. If you need help quitting, ask your health care provider.  If you have  diabetes, be aware that your blood sugar may be slightly elevated for several days after the injection.  Keep all follow-up visits. This is important. Contact a health care provider if you have:  Chills or a fever.  Any signs of infection at your injection site.  Increased pain or swelling or no relief after 2 days. Summary  A joint steroid injection is a treatment to relieve pain and swelling in a joint.  Steroids are medicines that reduce inflammation. Your health care provider may add an anesthetic along with the steroid.  You may have joint steroid injections as part of your arthritis treatment.  Joint steroid injections may be repeated, but having them too often can damage a joint or the skin over the joint.  Contact your health care provider if you have a fever, chills, or signs of infection, or if you get no relief from joint pain or swelling. This information is not intended to replace advice given to you by your health care provider. Make sure you discuss any questions you have with your health care provider. Document Revised: 10/22/2019 Document Reviewed: 10/22/2019 Elsevier Patient Education    2021 [employer]  

## 2020-09-26 NOTE — Progress Notes (Signed)
BP 108/68   Pulse 69   Temp 99.5 F (37.5 C) (Oral)   Wt 131 lb 12.8 oz (59.8 kg)   SpO2 98%   BMI 23.35 kg/m    Subjective:    Patient ID: Ashley Lawrence, female    DOB: Mar 17, 1956, 65 y.o.   MRN: [id number]  HPI: Ashley Lawrence is a 65 y.o. female  Chief Complaint  Patient presents with  . Shoulder Pain    Patient states she having pain in her right shoulder. Patient states she having pain in right wrist as well and would like to discuss with the provider.    SHOULDER PAIN Has had right shoulder pain for two years -- on and off from overworking and doing stuff.  Also reports some sciatic discomfort on and off to left lower back. Given Meloxicam last visit -- she reports taking this a few days and then noticed hair loss with this.  Imaging returned negative.  She feels it may be getting a little better, but is still there.  She would like shoulder injection today. Duration: chronic Involved shoulder: right Mechanism of injury: unknown Location: anterior Onset:gradual Severity: 8/10 at worst and 0/10 at best Quality:  sharp Frequency: intermittent Radiation: no Aggravating factors: lifting, movement and throwing  Alleviating factors: NSAIDs and rest, Meloxicam Status: fluctuating Treatments attempted: ibuprofen and aleve, Meloxicam, ice and heat Relief with NSAIDs?:  moderate Weakness: no Numbness: no Decreased grip strength: no Redness: no Swelling: no Bruising: no Fevers: no  Relevant past medical, surgical, family and social history reviewed and updated as indicated. Interim medical history since our last visit reviewed. Allergies and medications reviewed and updated.  Review of Systems  Constitutional: Negative for activity change, appetite change, diaphoresis, fatigue and fever.  Respiratory: Negative for cough, chest tightness and shortness of breath.   Cardiovascular: Negative for chest pain, palpitations and leg swelling.  Gastrointestinal: Negative.    Musculoskeletal: Positive for arthralgias.  Neurological: Negative.   Psychiatric/Behavioral: Negative.     Per HPI unless specifically indicated above     Objective:    BP 108/68   Pulse 69   Temp 99.5 F (37.5 C) (Oral)   Wt 131 lb 12.8 oz (59.8 kg)   SpO2 98%   BMI 23.35 kg/m   Wt Readings from Last 3 Encounters:  09/26/20 131 lb 12.8 oz (59.8 kg)  08/16/20 134 lb 6.4 oz (61 kg)  03/01/20 131 lb 12.8 oz (59.8 kg)    Physical Exam Vitals and nursing note reviewed.  Constitutional:      General: She is awake.     Appearance: She is well-developed.  HENT:     Head: Normocephalic.     Right Ear: Hearing normal.     Left Ear: Hearing normal.     Nose: Nose normal.     Mouth/Throat:     Mouth: Mucous membranes are moist.  Eyes:     General: Lids are normal.        Right eye: No discharge.        Left eye: No discharge.     Conjunctiva/sclera: Conjunctivae normal.     Pupils: Pupils are equal, round, and reactive to light.  Neck:     Thyroid: No thyromegaly.     Vascular: No carotid bruit or JVD.  Cardiovascular:     Rate and Rhythm: Normal rate and regular rhythm.     Heart sounds: Normal heart sounds. No murmur heard. No gallop.   Pulmonary:  Effort: Pulmonary effort is normal.     Breath sounds: Normal breath sounds.  Abdominal:     General: Bowel sounds are normal.     Palpations: Abdomen is soft. There is no hepatomegaly or splenomegaly.  Musculoskeletal:     Right shoulder: Tenderness present. No swelling, laceration, bony tenderness or crepitus. Normal range of motion. Normal strength.     Left shoulder: Normal.     Cervical back: Normal range of motion and neck supple.     Right lower leg: No edema.     Left lower leg: No edema.     Comments: Tenderness to lateral aspect bicep area on palpation.  Tenderness with flexion and extension.   Lymphadenopathy:     Cervical: No cervical adenopathy.  Skin:    General: Skin is warm and dry.   Neurological:     Mental Status: She is alert and oriented to person, place, and time.  Psychiatric:        Attention and Perception: Attention normal.        Mood and Affect: Mood normal.        Behavior: Behavior normal. Behavior is cooperative.        Thought Content: Thought content normal.        Judgment: Judgment normal.   STEROID INJECTION Procedure: Right Shoulder Steroid Injection  Description: After verbal consent and patient education on procedure.  Area prepped and draped using semi-sterile technique. Using a posterior approach, a mixture of 5 cc of  1% Marcaine & 1 cc of Kenalog 40 was injected into gleohumeral joint.  A bandage was then placed over the injection site. Complications:  none Post Procedure Instructions: To the ER if any symptoms of erythema or swelling.   Follow Up: PRN  Results for orders placed or performed in visit on 08/17/20  VITAMIN D 25 Hydroxy (Vit-D Deficiency, Fractures)  Result Value Ref Range   Vit D, 25-Hydroxy 70.8 30.0 - 100.0 ng/mL  HIV Antibody (routine testing w rflx)  Result Value Ref Range   HIV Screen 4th Generation wRfx Non Reactive Non Reactive  Hepatitis C antibody  Result Value Ref Range   Hep C Virus Ab <0.1 0.0 - 0.9 s/co ratio  CBC with Differential/Platelet  Result Value Ref Range   WBC 3.6 3.4 - 10.8 x10E3/uL   RBC 4.64 3.77 - 5.28 x10E6/uL   Hemoglobin 14.2 11.1 - 15.9 g/dL   Hematocrit 42.2 34.0 - 46.6 %   MCV 91 79 - 97 fL   MCH 30.6 26.6 - 33.0 pg   MCHC 33.6 31.5 - 35.7 g/dL   RDW 13.3 11.7 - 15.4 %   Platelets 225 150 - 450 x10E3/uL   Neutrophils 55 Not Estab. %   Lymphs 32 Not Estab. %   Monocytes 9 Not Estab. %   Eos 3 Not Estab. %   Basos 1 Not Estab. %   Neutrophils Absolute 2.0 1.4 - 7.0 x10E3/uL   Lymphocytes Absolute 1.2 0.7 - 3.1 x10E3/uL   Monocytes Absolute 0.3 0.1 - 0.9 x10E3/uL   EOS (ABSOLUTE) 0.1 0.0 - 0.4 x10E3/uL   Basophils Absolute 0.0 0.0 - 0.2 x10E3/uL   Immature Granulocytes 0 Not  Estab. %   Immature Grans (Abs) 0.0 0.0 - 0.1 x10E3/uL  Comprehensive metabolic panel  Result Value Ref Range   Glucose 82 65 - 99 mg/dL   BUN 15 8 - 27 mg/dL   Creatinine, Ser 1.02 (H) 0.57 - 1.00 mg/dL   eGFR 61 >  59 mL/min/1.73   BUN/Creatinine Ratio 15 12 - 28   Sodium 143 134 - 144 mmol/L   Potassium 4.1 3.5 - 5.2 mmol/L   Chloride 101 96 - 106 mmol/L   CO2 25 20 - 29 mmol/L   Calcium 9.5 8.7 - 10.3 mg/dL   Total Protein 6.4 6.0 - 8.5 g/dL   Albumin 4.5 3.8 - 4.8 g/dL   Globulin, Total 1.9 1.5 - 4.5 g/dL   Albumin/Globulin Ratio 2.4 (H) 1.2 - 2.2   Bilirubin Total 0.7 0.0 - 1.2 mg/dL   Alkaline Phosphatase 71 44 - 121 IU/L   AST 22 0 - 40 IU/L   ALT 19 0 - 32 IU/L  Lipid Panel w/o Chol/HDL Ratio  Result Value Ref Range   Cholesterol, Total 217 (H) 100 - 199 mg/dL   Triglycerides 92 0 - 149 mg/dL   HDL 66 >39 mg/dL   VLDL Cholesterol Cal 16 5 - 40 mg/dL   LDL Chol Calc (NIH) 135 (H) 0 - 99 mg/dL  TSH  Result Value Ref Range   TSH 2.460 0.450 - 4.500 uIU/mL  T4, free  Result Value Ref Range   Free T4 1.36 0.82 - 1.77 ng/dL      Assessment & Plan:   Problem List Items Addressed This Visit      Other   Chronic right shoulder pain    Chronic, ongoing for 2 years.  Steroid injection today without issue.  At this time she declines PT or Prednisone.  Will continue Meloxicam to use only as needed, recommend she not use daily and discussed risks vs benefits of medication.  Return to office in 4 weeks.          Follow up plan: Return in about 4 weeks (around 10/24/2020) for Right Shoulde Pain.

## 2021-01-09 ENCOUNTER — Ambulatory Visit (INDEPENDENT_AMBULATORY_CARE_PROVIDER_SITE_OTHER): Payer: No Typology Code available for payment source

## 2021-01-09 ENCOUNTER — Other Ambulatory Visit: Payer: Self-pay

## 2021-01-09 ENCOUNTER — Encounter: Payer: Self-pay | Admitting: Podiatry

## 2021-01-09 ENCOUNTER — Ambulatory Visit (INDEPENDENT_AMBULATORY_CARE_PROVIDER_SITE_OTHER): Payer: No Typology Code available for payment source | Admitting: Podiatry

## 2021-01-09 DIAGNOSIS — M7662 Achilles tendinitis, left leg: Secondary | ICD-10-CM

## 2021-01-09 DIAGNOSIS — M7661 Achilles tendinitis, right leg: Secondary | ICD-10-CM

## 2021-01-09 NOTE — Progress Notes (Signed)
Subjective:  Patient ID: Ashley Lawrence, female    DOB: 04-21-1956,  MRN: [id number]  Chief Complaint  Patient presents with   Foot Pain    Bilateral foot pain pain is worse in the morning     65 y.o. female presents with the above complaint.  Patient presents with complaint bilateral Achilles tendinitis insertional pain.  Patient states area is above moderate in nature.  She states hurts occasionally especially when getting up in the morning.  She does not have plantar fascial symptoms.  She has not seen MRIs prior to see me.  She would like to discuss treatment options.  She states that stretching does help.  Sometimes will go away after walking for a while.  There are some numbness associated with it.   Review of Systems: Negative except as noted in the HPI. Denies N/V/F/Ch.  No past medical history on file.  Current Outpatient Medications:    calcium carbonate (OSCAL) 1500 (600 Ca) MG TABS tablet, Take 600 mg by mouth 2 (two) times daily with a meal., Disp: , Rfl:    cholecalciferol (VITAMIN D3) 25 MCG (1000 UNIT) tablet, Take 4,000 Units by mouth daily., Disp: , Rfl:    levothyroxine (SYNTHROID) 50 MCG tablet, Take 1 tablet (50 mcg total) by mouth daily., Disp: 90 tablet, Rfl: 4   meloxicam (MOBIC) 7.5 MG tablet, Take 1 tablet (7.5 mg total) by mouth daily. (Patient not taking: Reported on 09/26/2020), Disp: 30 tablet, Rfl: 4   Multiple Vitamin (MULTIVITAMIN) tablet, Take 1 tablet by mouth daily., Disp: , Rfl:   Social History   Tobacco Use  Smoking Status Never  Smokeless Tobacco Never    No Known Allergies Objective:  There were no vitals filed for this visit. There is no height or weight on file to calculate BMI. Constitutional Well developed. Well nourished.  Vascular Dorsalis pedis pulses palpable bilaterally. Posterior tibial pulses palpable bilaterally. Capillary refill normal to all digits.  No cyanosis or clubbing noted. Pedal hair growth normal.  Neurologic Normal  speech. Oriented to person, place, and time. Epicritic sensation to light touch grossly present bilaterally.  Dermatologic Nails well groomed and normal in appearance. No open wounds. No skin lesions.  Orthopedic: Pain on palpation to Achilles tendon insertion.  Pain with dorsiflexion of the ankle joint.  No pain with plantarflexion of the ankle joint.  Mild Haglund's deformity noted.  Positive Silfverskiold test with gastrocnemius equinus noted.  No other bony abnormalities identified clinically.  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  Bullet hole sinus tarsi noted.  Midfoot arthritis noted mild.  No other bony abnormalities identified no spurring noted Assessment:   1. Right Achilles tendinitis   2. Left Achilles tendinitis    Plan:  Patient was evaluated and treated and all questions answered.  Bilateral Achilles tendinitis with gastrocnemius equinus -I explained the patient the etiology of Achilles tendinitis and worse treatment options were discussed.  Given that her pain is more mild to moderate in nature I believe patient will benefit from steroid injection of decrease acute inflammatory component associated pain.  Patient agrees with plan like to proceed with a steroid injection.  I discussed the risk of rupture associated with it.  She states understanding. -A steroid injection was performed at bilateral Kager's fat pad using 1% plain Lidocaine and 10 mg of Kenalog. This was well tolerated.   No follow-ups on file.

## 2021-03-14 IMAGING — MG MM DIGITAL SCREENING BILAT W/ TOMO W/ CAD
8 series · 8 of 24 positions shown · non-contrast
Comparison: None.
COMPARISON: None.

Addendum:
CLINICAL DATA: Screening.

EXAM:
DIGITAL SCREENING BILATERAL MAMMOGRAM WITH TOMO AND CAD

[R MLO synth-2D]
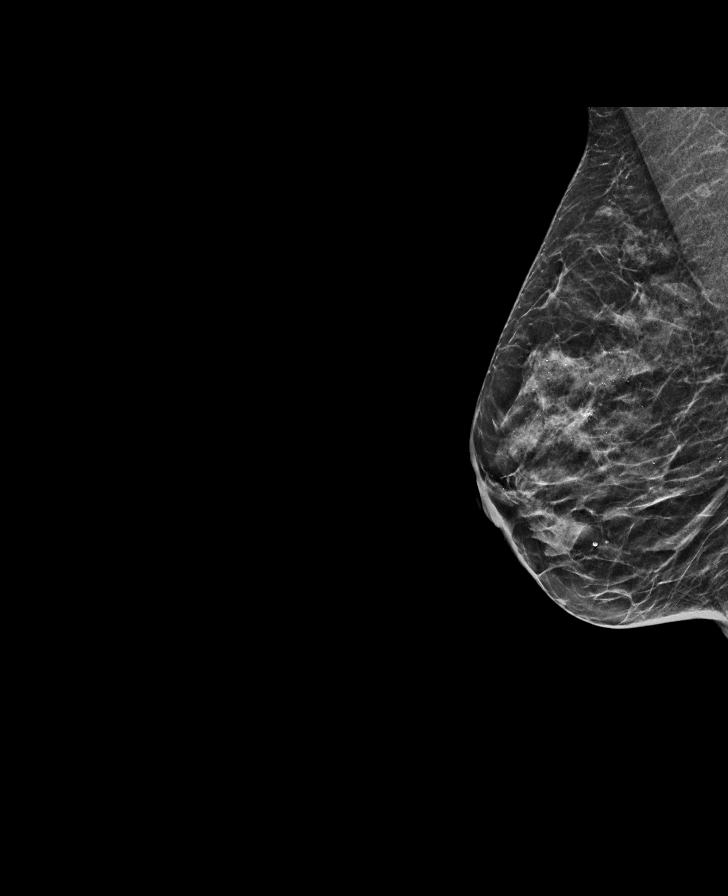

[L MLO synth-2D]
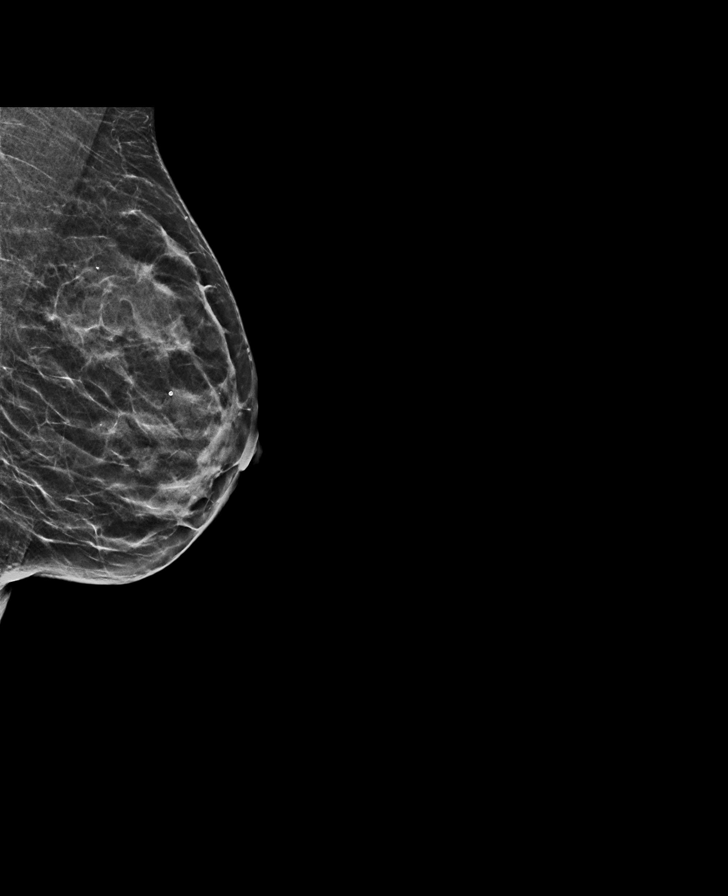

[L CC synth-2D]
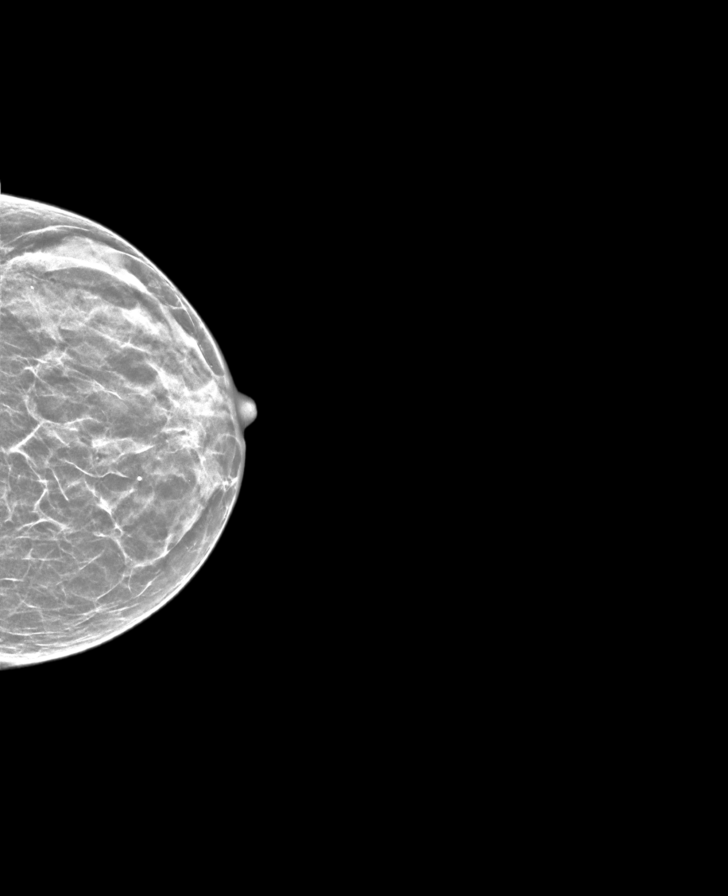

[R CC synth-2D]
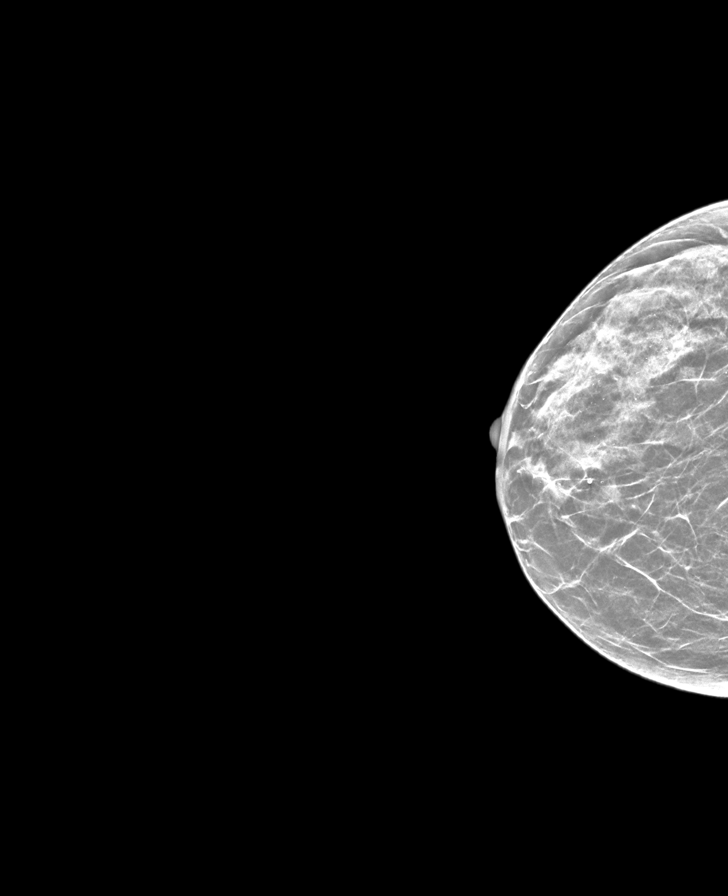

[R CC tomo · tomo slice 21/42.0]
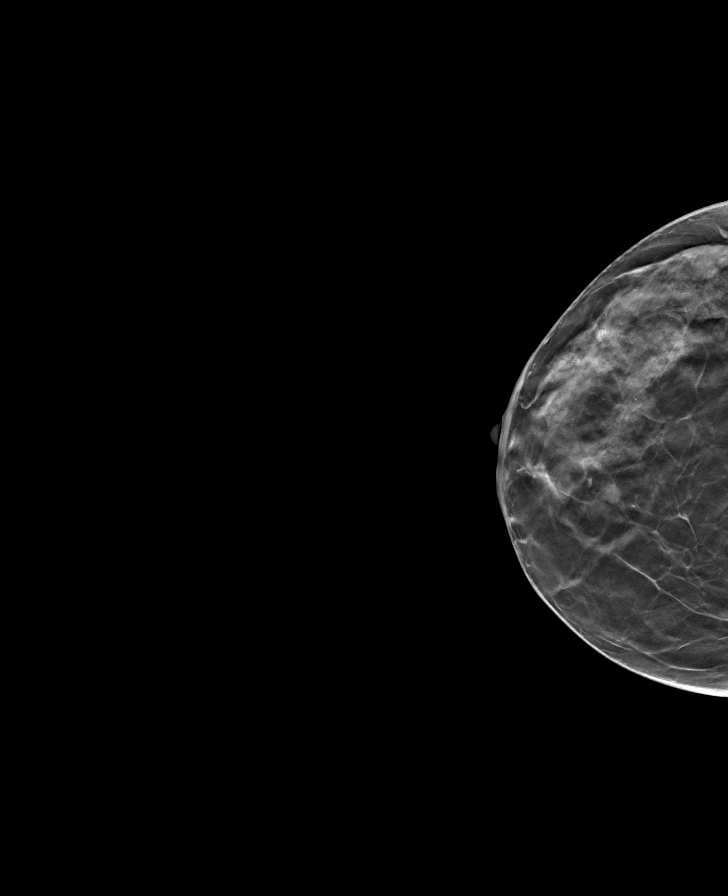

[L MLO tomo · tomo slice 21/41.0]
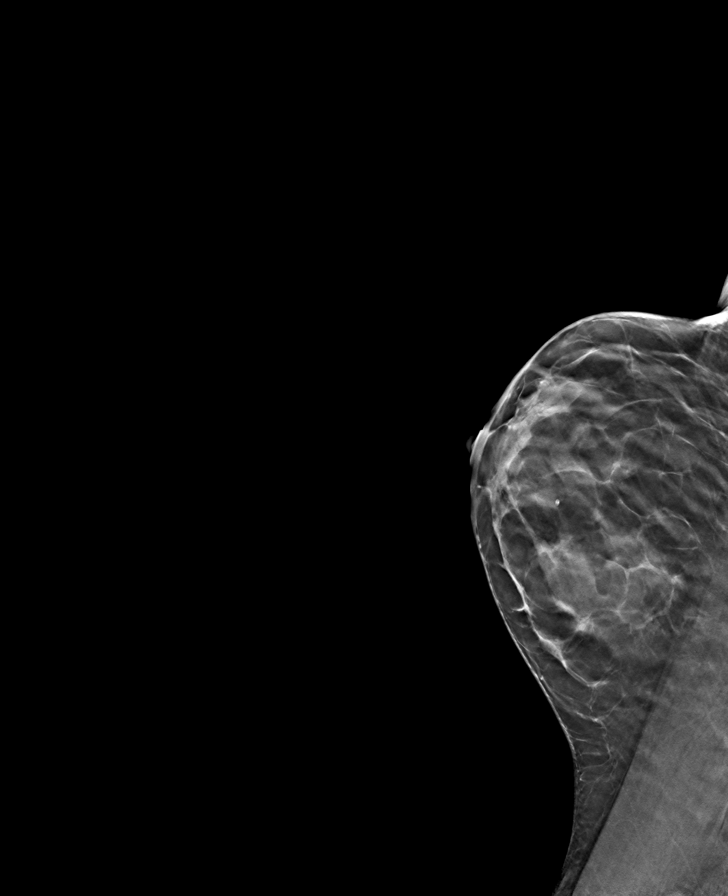

[R MLO tomo · tomo slice 23/44.0]
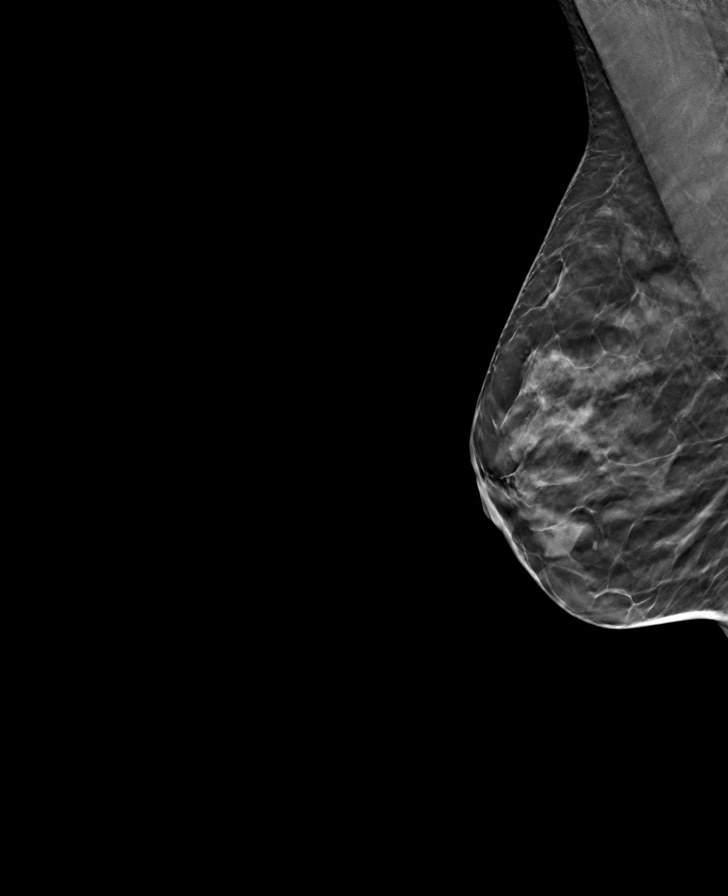

[L CC tomo · tomo slice 19/38.0]
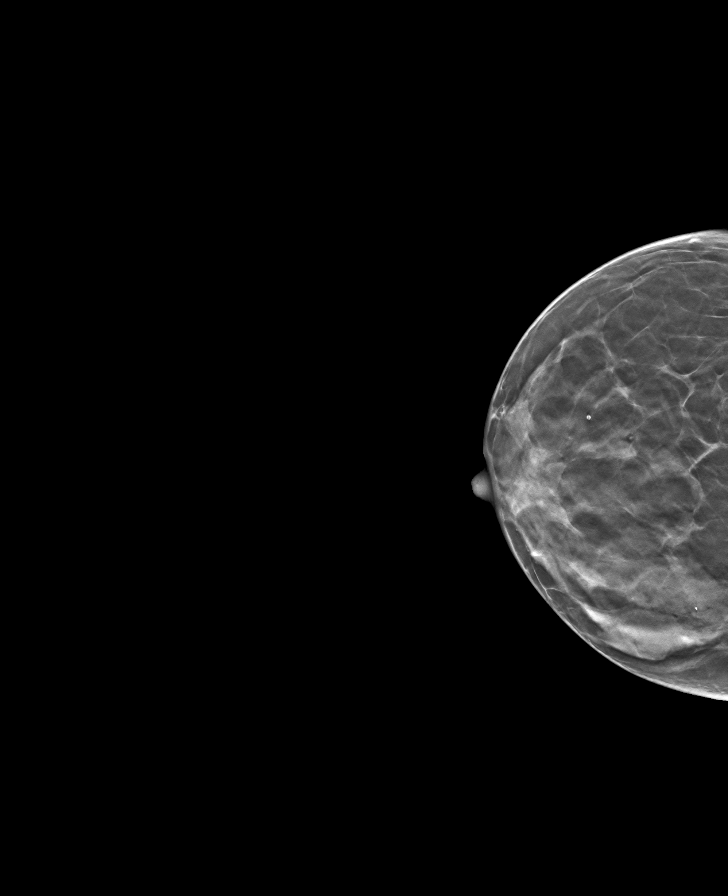

[8 of 24 positions shown; findings below may reference images not displayed]

ACR Breast Density Category c: The breast tissue is heterogeneously
dense, which may obscure small masses.
FINDINGS: In the right breast , calcifications requires further evaluation.

In the left breast , possible distortion requires further
evaluation. This possible distortion is seen within the upper LEFT
breast, MLO view only, at posterior depth, slice 30.

Images were processed with CAD.
IMPRESSION: Further evaluation is suggested for calcifications in the right
breast.

Further evaluation is suggested for possible distortion in the left
breast.

RECOMMENDATION:
Diagnostic mammogram and possibly ultrasound of both breasts.
(Code:H0-C-117)

The patient will be contacted regarding the findings, and additional
imaging will be scheduled.

BI-RADS CATEGORY  0: Incomplete. Need additional imaging evaluation
and/or prior mammograms for comparison.

ADDENDUM:
Prior studies are now available. The scattered calcifications within
the RIGHT breast are stable for at least 4 years confirming
benignity. There is a possible new distortion within the upper LEFT
breast, MLO view only, at posterior depth, slice 30.
IMPRESSION: 1. Further evaluation is suggested for possible distortion in the
LEFT breast.
2. No evidence of malignancy within the RIGHT breast. Stable benign
calcifications within the RIGHT breast.

RECOMMENDATION: Diagnostic mammogram, and possible ultrasound, of
the LEFT breast.

BI-RADS CATEGORY 0: Incomplete.  Need additional imaging evaluation.

*** End of Addendum ***
ACR Breast Density Category c: The breast tissue is heterogeneously
dense, which may obscure small masses.
FINDINGS: In the right breast , calcifications requires further evaluation.

In the left breast , possible distortion requires further
evaluation. This possible distortion is seen within the upper LEFT
breast, MLO view only, at posterior depth, slice 30.

Images were processed with CAD.
IMPRESSION: Further evaluation is suggested for calcifications in the right
breast.

Further evaluation is suggested for possible distortion in the left
breast.

RECOMMENDATION:
Diagnostic mammogram and possibly ultrasound of both breasts.
(Code:H0-C-117)

The patient will be contacted regarding the findings, and additional
imaging will be scheduled.

BI-RADS CATEGORY  0: Incomplete. Need additional imaging evaluation
and/or prior mammograms for comparison.

## 2021-03-23 ENCOUNTER — Other Ambulatory Visit: Payer: Self-pay

## 2021-03-23 ENCOUNTER — Ambulatory Visit (INDEPENDENT_AMBULATORY_CARE_PROVIDER_SITE_OTHER): Payer: No Typology Code available for payment source | Admitting: Nurse Practitioner

## 2021-03-23 ENCOUNTER — Encounter: Payer: Self-pay | Admitting: Nurse Practitioner

## 2021-03-23 VITALS — BP 94/60 | HR 64 | Temp 98.5°F | Wt 132.6 lb

## 2021-03-23 DIAGNOSIS — E039 Hypothyroidism, unspecified: Secondary | ICD-10-CM

## 2021-03-23 DIAGNOSIS — R432 Parageusia: Secondary | ICD-10-CM | POA: Diagnosis not present

## 2021-03-23 NOTE — Progress Notes (Signed)
BP 94/60   Pulse 64   Temp 98.5 F (36.9 C) (Oral)   Wt 132 lb 9.6 oz (60.1 kg)   SpO2 98%   BMI 23.49 kg/m    Subjective:    Patient ID: Ashley Lawrence, female    DOB: 1955-11-07, 65 y.o.   MRN: [id number]  HPI: Ashley Lawrence is a 65 y.o. female  Chief Complaint  Patient presents with   Follow-up    Patient states she is here to discuss having lab work done for not having her taste which she spoke with Henrine Screws about back in July via [place]. Patient states she is "over it" and would like to discuss with lab work would help explain the issue as she says it could be other things going on.    AGEUSIA & HYPOSMIA: Has had ongoing loss of taste since Covid infection.  First noted back in July.  Can taste sweet tea, but no other foods or drinks. Food tastes like chemicals + all tastes the same.  Had Covid in February, at time when this started in July.  Occasional loss of smell noted too.  She is frustrated with this loss of taste and smell.  Fever: no Cough: no Shortness of breath: no Wheezing: no Chest pain: none Chest tightness: no Chest congestion: no Nasal congestion: no Runny nose: no Post nasal drip: no Sneezing: no Sore throat: no Swollen glands: no Sinus pressure: no Headache: no Face pain: no Toothache: no Ear pain: none Ear pressure: none Eyes red/itching:no Eye drainage/crusting: no  Vomiting: no Rash: no Fatigue: no  Relevant past medical, surgical, family and social history reviewed and updated as indicated. Interim medical history since our last visit reviewed. Allergies and medications reviewed and updated.  Review of Systems  Constitutional:  Negative for activity change, appetite change, diaphoresis, fatigue and fever.  HENT:  Negative for congestion, ear pain, postnasal drip, rhinorrhea, sinus pressure, sinus pain, sore throat and voice change.   Respiratory: Negative.    Cardiovascular: Negative.   Neurological: Negative.   Psychiatric/Behavioral:  Negative.     Per HPI unless specifically indicated above     Objective:    BP 94/60   Pulse 64   Temp 98.5 F (36.9 C) (Oral)   Wt 132 lb 9.6 oz (60.1 kg)   SpO2 98%   BMI 23.49 kg/m   Wt Readings from Last 3 Encounters:  03/23/21 132 lb 9.6 oz (60.1 kg)  09/26/20 131 lb 12.8 oz (59.8 kg)  08/16/20 134 lb 6.4 oz (61 kg)    Physical Exam Vitals and nursing note reviewed.  Constitutional:      General: She is awake. She is not in acute distress.    Appearance: She is well-developed and well-groomed. She is not ill-appearing or toxic-appearing.  HENT:     Head: Normocephalic and atraumatic.     Right Ear: Hearing, ear canal and external ear normal. No drainage. A middle ear effusion is present.     Left Ear: Hearing, ear canal and external ear normal. No drainage. A middle ear effusion is present.     Nose: Nose normal.     Right Turbinates: Not enlarged or swollen.     Left Turbinates: Not enlarged or swollen.     Right Sinus: No maxillary sinus tenderness or frontal sinus tenderness.     Left Sinus: No maxillary sinus tenderness or frontal sinus tenderness.     Mouth/Throat:     Mouth: Mucous membranes are moist.  Pharynx: Oropharynx is clear. Uvula midline. No pharyngeal swelling, oropharyngeal exudate or posterior oropharyngeal erythema.  Eyes:     General: Lids are normal.        Right eye: No discharge.        Left eye: No discharge.     Extraocular Movements: Extraocular movements intact.     Conjunctiva/sclera: Conjunctivae normal.     Pupils: Pupils are equal, round, and reactive to light.     Visual Fields: Right eye visual fields normal and left eye visual fields normal.  Neck:     Thyroid: No thyromegaly.     Vascular: No carotid bruit.  Cardiovascular:     Rate and Rhythm: Normal rate and regular rhythm.     Heart sounds: Normal heart sounds. No murmur heard.   No gallop.  Pulmonary:     Effort: Pulmonary effort is normal. No accessory muscle usage  or respiratory distress.     Breath sounds: Normal breath sounds.  Abdominal:     General: Bowel sounds are normal.     Palpations: Abdomen is soft. There is no hepatomegaly or splenomegaly.     Tenderness: There is no abdominal tenderness.  Musculoskeletal:        General: Normal range of motion.     Cervical back: Normal range of motion and neck supple.     Lumbar back: Normal.     Right lower leg: No edema.     Left lower leg: No edema.  Lymphadenopathy:     Head:     Right side of head: No submental, submandibular, tonsillar, preauricular or posterior auricular adenopathy.     Left side of head: No submental, submandibular, tonsillar, preauricular or posterior auricular adenopathy.     Cervical: No cervical adenopathy.  Skin:    General: Skin is warm and dry.     Capillary Refill: Capillary refill takes less than 2 seconds.     Findings: No rash.  Neurological:     Mental Status: She is alert and oriented to person, place, and time.     Gait: Gait is intact.     Deep Tendon Reflexes: Reflexes are normal and symmetric.     Reflex Scores:      Brachioradialis reflexes are 2+ on the right side and 2+ on the left side.      Patellar reflexes are 2+ on the right side and 2+ on the left side. Psychiatric:        Attention and Perception: Attention normal.        Mood and Affect: Mood normal.        Speech: Speech normal.        Behavior: Behavior normal. Behavior is cooperative.        Thought Content: Thought content normal.        Judgment: Judgment normal.    Results for orders placed or performed in visit on 08/17/20  VITAMIN D 25 Hydroxy (Vit-D Deficiency, Fractures)  Result Value Ref Range   Vit D, 25-Hydroxy 70.8 30.0 - 100.0 ng/mL  HIV Antibody (routine testing w rflx)  Result Value Ref Range   HIV Screen 4th Generation wRfx Non Reactive Non Reactive  Hepatitis C antibody  Result Value Ref Range   Hep C Virus Ab <0.1 0.0 - 0.9 s/co ratio  CBC with  Differential/Platelet  Result Value Ref Range   WBC 3.6 3.4 - 10.8 x10E3/uL   RBC 4.64 3.77 - 5.28 x10E6/uL   Hemoglobin 14.2 11.1 - 15.9  g/dL   Hematocrit 42.2 34.0 - 46.6 %   MCV 91 79 - 97 fL   MCH 30.6 26.6 - 33.0 pg   MCHC 33.6 31.5 - 35.7 g/dL   RDW 13.3 11.7 - 15.4 %   Platelets 225 150 - 450 x10E3/uL   Neutrophils 55 Not Estab. %   Lymphs 32 Not Estab. %   Monocytes 9 Not Estab. %   Eos 3 Not Estab. %   Basos 1 Not Estab. %   Neutrophils Absolute 2.0 1.4 - 7.0 x10E3/uL   Lymphocytes Absolute 1.2 0.7 - 3.1 x10E3/uL   Monocytes Absolute 0.3 0.1 - 0.9 x10E3/uL   EOS (ABSOLUTE) 0.1 0.0 - 0.4 x10E3/uL   Basophils Absolute 0.0 0.0 - 0.2 x10E3/uL   Immature Granulocytes 0 Not Estab. %   Immature Grans (Abs) 0.0 0.0 - 0.1 x10E3/uL  Comprehensive metabolic panel  Result Value Ref Range   Glucose 82 65 - 99 mg/dL   BUN 15 8 - 27 mg/dL   Creatinine, Ser 1.02 (H) 0.57 - 1.00 mg/dL   eGFR 61 >59 mL/min/1.73   BUN/Creatinine Ratio 15 12 - 28   Sodium 143 134 - 144 mmol/L   Potassium 4.1 3.5 - 5.2 mmol/L   Chloride 101 96 - 106 mmol/L   CO2 25 20 - 29 mmol/L   Calcium 9.5 8.7 - 10.3 mg/dL   Total Protein 6.4 6.0 - 8.5 g/dL   Albumin 4.5 3.8 - 4.8 g/dL   Globulin, Total 1.9 1.5 - 4.5 g/dL   Albumin/Globulin Ratio 2.4 (H) 1.2 - 2.2   Bilirubin Total 0.7 0.0 - 1.2 mg/dL   Alkaline Phosphatase 71 44 - 121 IU/L   AST 22 0 - 40 IU/L   ALT 19 0 - 32 IU/L  Lipid Panel w/o Chol/HDL Ratio  Result Value Ref Range   Cholesterol, Total 217 (H) 100 - 199 mg/dL   Triglycerides 92 0 - 149 mg/dL   HDL 66 >39 mg/dL   VLDL Cholesterol Cal 16 5 - 40 mg/dL   LDL Chol Calc (NIH) 135 (H) 0 - 99 mg/dL  TSH  Result Value Ref Range   TSH 2.460 0.450 - 4.500 uIU/mL  T4, free  Result Value Ref Range   Free T4 1.36 0.82 - 1.77 ng/dL      Assessment & Plan:   Problem List Items Addressed This Visit       Endocrine   Hypothyroid    Chronic, ongoing.  Continue current medication regimen and  adjust as needed.  Thyroid labs today.      Relevant Orders   TSH   T4, free   Thyroid peroxidase antibody     Other   Ageusia - Primary    Ongoing since July, no infection at the time.  Does have history of Covid in February. Overall exam today unremarkable.  ?Covid related.  At this time recommend taking Claritin daily for the next two weeks to see if benefit.  Labs today to further assess: CBC, CMP, TSH, PTH, ESR, CRP, ANA, B12, Lead, Zinc.  If overall normal on return have discussed with her referral to ENT to further assess.  Will determine next steps after return of labs.      Relevant Orders   CBC with Differential/Platelet   Comprehensive metabolic panel   TSH   T4, free   Lead, blood (adult age 34 yrs or greater)   PTH, Intact and Calcium   ANA w/Reflex if Positive  C-reactive protein   Sed Rate (ESR)   B12   Zinc     Follow up plan: Return in about 21 weeks (around 08/17/2021) for Annual physical.

## 2021-03-23 NOTE — Patient Instructions (Signed)
Allergic Rhinitis, Adult  Allergic rhinitis is an allergic reaction that affects the mucous membraneinside the nose. The mucous membrane is the tissue that produces mucus. There are two types of allergic rhinitis: Seasonal. This type is also called hay fever and happens only during certain seasons. Perennial. This type can happen at any time of the year. Allergic rhinitis cannot be spread from person to person. This condition can bemild, moderate, or severe. It can develop at any age and may be outgrown. What are the causes? This condition is caused by allergens. These are things that can cause an allergic reaction. Allergens may differ for seasonal allergic rhinitis and perennial allergic rhinitis. Seasonal allergic rhinitis is triggered by pollen. Pollen can come from grasses, trees, and weeds. Perennial allergic rhinitis may be triggered by: Dust mites. Proteins in a pet's urine, saliva, or dander. Dander is dead skin cells from a pet. Smoke, mold, or car fumes. What increases the risk? You are more likely to develop this condition if you have a family history of allergies or other conditions related to allergies, including: Allergic conjunctivitis. This is inflammation of parts of the eyes and eyelids. Asthma. This condition affects the lungs and makes it hard to breathe. Atopic dermatitis or eczema. This is long term (chronic) inflammation of the skin. Food allergies. What are the signs or symptoms? Symptoms of this condition include: Sneezing or coughing. A stuffy nose (nasal congestion), itchy nose, or nasal discharge. Itchy eyes and tearing of the eyes. A feeling of mucus dripping down the back of your throat (postnasal drip). Trouble sleeping. Tiredness or fatigue. Headache. Sore throat. How is this diagnosed? This condition may be diagnosed with your symptoms, medical history, and physical exam. Your health care provider may check for related conditions, such  as: Asthma. Pink eye. This is eye inflammation caused by infection (conjunctivitis). Ear infection. Upper respiratory infection. This is an infection in the nose, throat, or upper airways. You may also have tests to find out which allergens trigger your symptoms.These may include skin tests or blood tests. How is this treated? There is no cure for this condition, but treatment can help control symptoms. Treatment may include: Taking medicines that block allergy symptoms, such as corticosteroids and antihistamines. Medicine may be given as a shot, nasal spray, or pill. Avoiding any allergens. Being exposed again and again to tiny amounts of allergens to help you build a defense against allergens (immunotherapy). This is done if other treatments have not helped. It may include: Allergy shots. These are injected medicines that have small amounts of allergen in them. Sublingual immunotherapy. This involves taking small doses of a medicine with allergen in it under your tongue. If these treatments do not work, your health care provider may prescribe newer,stronger medicines. Follow these instructions at home: Avoiding allergens Find out what you are allergic to and avoid those allergens. These are some things you can do to help avoid allergens: If you have perennial allergies: Replace carpet with wood, tile, or vinyl flooring. Carpet can trap dander and dust. Do not smoke. Do not allow smoking in your home. Change your heating and air conditioning filters at least once a month. If you have seasonal allergies, take these steps during allergy season: Keep windows closed as much as possible. Plan outdoor activities when pollen counts are lowest. Check pollen counts before you plan outdoor activities. When coming indoors, change clothing and shower before sitting on furniture or bedding. If you have a pet in the house that   produces allergens: Keep the pet out of the bedroom. Vacuum, sweep, and dust  regularly. General instructions Take over-the-counter and prescription medicines only as told by your health care provider. Drink enough fluid to keep your urine pale yellow. Keep all follow-up visits as told by your health care provider. This is important. Where to find more information American Academy of Allergy, Asthma & Immunology: www.aaaai.org Contact a health care provider if: You have a fever. You develop a cough that does not go away. You make whistling sounds when you breathe (wheeze). Your symptoms slow you down or stop you from doing your normal activities each day. Get help right away if: You have shortness of breath. This symptom may represent a serious problem that is an emergency. Do not wait to see if the symptom will go away. Get medical help right away. Call your local emergency services (911 in the [place] Do not drive yourself to the hospital. Summary Allergic rhinitis may be managed by taking medicines as directed and avoiding allergens. If you have seasonal allergies, keep windows closed as much as possible during allergy season. Contact your health care provider if you develop a fever or a cough that does not go away. This information is not intended to replace advice given to you by your health care provider. Make sure you discuss any questions you have with your healthcare provider. Document Revised: 07/02/2019 Document Reviewed: 05/11/2019 Elsevier Patient Education  2022 Elsevier Inc.  

## 2021-03-23 NOTE — Assessment & Plan Note (Signed)
Chronic, ongoing.  Continue current medication regimen and adjust as needed.  Thyroid labs today. 

## 2021-03-23 NOTE — Assessment & Plan Note (Signed)
Ongoing since July, no infection at the time.  Does have history of Covid in February. Overall exam today unremarkable.  ?Covid related.  At this time recommend taking Claritin daily for the next two weeks to see if benefit.  Labs today to further assess: CBC, CMP, TSH, PTH, ESR, CRP, ANA, B12, Lead, Zinc.  If overall normal on return have discussed with her referral to ENT to further assess.  Will determine next steps after return of labs.

## 2021-03-25 NOTE — Progress Notes (Signed)
Contacted via MyChart   Good morning Arabela, some of your labs have returned (still waiting on a few more and will let you know when these come back).   - CBC shows no anemia and B12 level is normal - Kidney function is showing some very mild kidney disease with creatinine at 1.10 and eGFR 56 -- are you drinking a good amount of fluid at home daily?  You are not taking any Ibuprofen products at home?  Would like to recheck this at next visit.  Avoid Ibuprofen products and drink good amount of water daily. - Liver function is normal, AST and ALT. - Thyroid levels normal, continue current Levothyroxine dosing. - PTH, parathyroid level, normal. - Inflammatory labs: ANA, CRP, ESR are all normal At this time no real explanation for loss of taste, suspect this is more post viral related issue.  If remainder of labs return normal I would like to get you in with ENT.  Any questions? Keep being amazing!!  Thank you for allowing me to participate in your care.  I appreciate you. Kindest regards, Onna Nodal

## 2021-03-26 LAB — CBC WITH DIFFERENTIAL/PLATELET
Basophils Absolute: 0 10*3/uL (ref 0.0–0.2)
Basos: 0 %
EOS (ABSOLUTE): 0.1 10*3/uL (ref 0.0–0.4)
Eos: 3 %
Hematocrit: 43.3 % (ref 34.0–46.6)
Hemoglobin: 14.7 g/dL (ref 11.1–15.9)
Immature Grans (Abs): 0 10*3/uL (ref 0.0–0.1)
Immature Granulocytes: 0 %
Lymphocytes Absolute: 1.4 10*3/uL (ref 0.7–3.1)
Lymphs: 25 %
MCH: 30.8 pg (ref 26.6–33.0)
MCHC: 33.9 g/dL (ref 31.5–35.7)
MCV: 91 fL (ref 79–97)
Monocytes Absolute: 0.3 10*3/uL (ref 0.1–0.9)
Monocytes: 5 %
Neutrophils Absolute: 3.7 10*3/uL (ref 1.4–7.0)
Neutrophils: 67 %
Platelets: 221 10*3/uL (ref 150–450)
RBC: 4.77 x10E6/uL (ref 3.77–5.28)
RDW: 13 % (ref 11.7–15.4)
WBC: 5.6 10*3/uL (ref 3.4–10.8)

## 2021-03-26 LAB — COMPREHENSIVE METABOLIC PANEL
ALT: 11 IU/L (ref 0–32)
AST: 22 IU/L (ref 0–40)
Albumin/Globulin Ratio: 2.9 — ABNORMAL HIGH (ref 1.2–2.2)
Albumin: 5 g/dL — ABNORMAL HIGH (ref 3.8–4.8)
Alkaline Phosphatase: 66 IU/L (ref 44–121)
BUN/Creatinine Ratio: 15 (ref 12–28)
BUN: 17 mg/dL (ref 8–27)
Bilirubin Total: 0.7 mg/dL (ref 0.0–1.2)
CO2: 25 mmol/L (ref 20–29)
Calcium: 9.6 mg/dL (ref 8.7–10.3)
Chloride: 104 mmol/L (ref 96–106)
Creatinine, Ser: 1.1 mg/dL — ABNORMAL HIGH (ref 0.57–1.00)
Globulin, Total: 1.7 g/dL (ref 1.5–4.5)
Glucose: 83 mg/dL (ref 70–99)
Potassium: 4.1 mmol/L (ref 3.5–5.2)
Sodium: 144 mmol/L (ref 134–144)
Total Protein: 6.7 g/dL (ref 6.0–8.5)
eGFR: 56 mL/min/{1.73_m2} — ABNORMAL LOW (ref >59)

## 2021-03-26 LAB — VITAMIN B12: Vitamin B-12: 632 pg/mL (ref 232–1245)

## 2021-03-26 LAB — PTH, INTACT AND CALCIUM: PTH: 20 pg/mL (ref 15–65)

## 2021-03-26 LAB — LEAD, BLOOD (ADULT >= 16 YRS): Lead-Whole Blood: 1.3 ug/dL (ref 0.0–3.4)

## 2021-03-26 LAB — C-REACTIVE PROTEIN: CRP: <1 mg/L (ref 0–10)

## 2021-03-26 LAB — THYROID PEROXIDASE ANTIBODY: Thyroperoxidase Ab SerPl-aCnc: 14 IU/mL (ref 0–34)

## 2021-03-26 LAB — ZINC: Zinc: 78 ug/dL (ref 44–115)

## 2021-03-26 LAB — T4, FREE: Free T4: 1.42 ng/dL (ref 0.82–1.77)

## 2021-03-26 LAB — TSH: TSH: 2.88 u[IU]/mL (ref 0.450–4.500)

## 2021-03-26 LAB — SEDIMENTATION RATE: Sed Rate: 2 mm/hr (ref 0–40)

## 2021-03-26 LAB — ANA W/REFLEX IF POSITIVE: Anti Nuclear Antibody (ANA): NEGATIVE

## 2021-03-26 NOTE — Progress Notes (Signed)
Contacted via MyChart   Lead level and Zinc normal.  I would recommend a referral to ENT for further assessment, what are your thoughts on this?

## 2021-05-04 ENCOUNTER — Encounter: Payer: Self-pay | Admitting: Nurse Practitioner

## 2021-05-04 DIAGNOSIS — M5432 Sciatica, left side: Secondary | ICD-10-CM

## 2021-05-11 NOTE — Addendum Note (Signed)
Addended by: Aura Dials T on: 05/11/2021 12:01 PM   Modules accepted: Orders

## 2021-05-14 ENCOUNTER — Ambulatory Visit
Admission: RE | Admit: 2021-05-14 | Discharge: 2021-05-14 | Disposition: A | Discharge status: Home / Self Care | Payer: No Typology Code available for payment source | Attending: Nurse Practitioner | Admitting: Nurse Practitioner

## 2021-05-14 ENCOUNTER — Ambulatory Visit
Admission: RE | Admit: 2021-05-14 | Discharge: 2021-05-14 | Disposition: A | Discharge status: Home / Self Care | Payer: No Typology Code available for payment source | Source: Ambulatory Visit | Attending: Nurse Practitioner | Admitting: Nurse Practitioner

## 2021-05-14 ENCOUNTER — Other Ambulatory Visit: Payer: Self-pay

## 2021-05-14 DIAGNOSIS — M5432 Sciatica, left side: Secondary | ICD-10-CM | POA: Insufficient documentation

## 2021-05-14 NOTE — Progress Notes (Signed)
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  If any questions please let me know.   Keep being amazing!!  Thank you for allowing me to participate in your care.  I appreciate you. Kindest regards, Yousra Ivens

## 2021-07-02 ENCOUNTER — Other Ambulatory Visit: Payer: Self-pay | Admitting: Nurse Practitioner

## 2021-07-02 DIAGNOSIS — Z1231 Encounter for screening mammogram for malignant neoplasm of breast: Secondary | ICD-10-CM

## 2021-08-30 ENCOUNTER — Ambulatory Visit: Payer: No Typology Code available for payment source

## 2021-10-11 ENCOUNTER — Ambulatory Visit
Admission: RE | Admit: 2021-10-11 | Discharge: 2021-10-11 | Disposition: A | Discharge status: Home / Self Care | Payer: No Typology Code available for payment source | Source: Ambulatory Visit | Attending: Nurse Practitioner | Admitting: Nurse Practitioner

## 2021-10-11 DIAGNOSIS — Z1231 Encounter for screening mammogram for malignant neoplasm of breast: Secondary | ICD-10-CM | POA: Insufficient documentation

## 2021-10-11 NOTE — Progress Notes (Signed)
Contacted via MyChart   Normal mammogram, may repeat in one year:)

## 2021-10-13 ENCOUNTER — Other Ambulatory Visit: Payer: Self-pay | Admitting: Nurse Practitioner

## 2021-10-15 NOTE — Telephone Encounter (Signed)
Requested Prescriptions  Pending Prescriptions Disp Refills  . levothyroxine (SYNTHROID) 50 MCG tablet [Pharmacy Med Name: LEVOTHYROXINE SODIUM 50 MCG TAB] 90 tablet 1    Sig: TAKE 1 TABLET BY MOUTH ONCE DAILY     Endocrinology:  Hypothyroid Agents Passed - 10/13/2021 10:21 AM      Passed - TSH in normal range and within 360 days    TSH  Date Value Ref Range Status  03/23/2021 2.880 0.450 - 4.500 uIU/mL Final         Passed - Valid encounter within last 12 months    Recent Outpatient Visits          6 months ago [facility] [place], [place] T, NP   1 year ago Chronic right shoulder pain   [place], [place] T, NP   1 year ago Hypothyroidism, unspecified type   [place], Barbaraann Faster, NP   1 year ago Hypothyroidism, unspecified type   [place], Barbaraann Faster, NP   2 years ago Annual physical exam   [facility] [place], Barbaraann Faster, NP

## 2021-12-25 ENCOUNTER — Encounter: Payer: No Typology Code available for payment source | Admitting: Nurse Practitioner

## 2021-12-30 NOTE — Patient Instructions (Signed)

## 2022-01-04 ENCOUNTER — Other Ambulatory Visit: Payer: Self-pay | Admitting: Nurse Practitioner

## 2022-01-04 ENCOUNTER — Ambulatory Visit (INDEPENDENT_AMBULATORY_CARE_PROVIDER_SITE_OTHER): Payer: No Typology Code available for payment source | Admitting: Nurse Practitioner

## 2022-01-04 ENCOUNTER — Encounter: Payer: Self-pay | Admitting: Nurse Practitioner

## 2022-01-04 VITALS — BP 96/68 | HR 56 | Temp 98.2°F | Ht 63.0 in | Wt 131.2 lb

## 2022-01-04 DIAGNOSIS — E7800 Pure hypercholesterolemia, unspecified: Secondary | ICD-10-CM

## 2022-01-04 DIAGNOSIS — E039 Hypothyroidism, unspecified: Secondary | ICD-10-CM

## 2022-01-04 DIAGNOSIS — M85851 Other specified disorders of bone density and structure, right thigh: Secondary | ICD-10-CM

## 2022-01-04 DIAGNOSIS — Z0000 Encounter for general adult medical examination without abnormal findings: Secondary | ICD-10-CM

## 2022-01-04 DIAGNOSIS — Z2821 Immunization not carried out because of patient refusal: Secondary | ICD-10-CM

## 2022-01-04 DIAGNOSIS — R21 Rash and other nonspecific skin eruption: Secondary | ICD-10-CM | POA: Insufficient documentation

## 2022-01-04 MED ORDER — LEVOTHYROXINE SODIUM 50 MCG PO TABS
50.0000 ug | ORAL_TABLET | Freq: Every day | ORAL | 4 refills | Status: DC
Start: 2022-01-04 — End: 2023-01-09

## 2022-01-04 MED ORDER — TRIAMCINOLONE ACETONIDE 0.1 % EX CREA: 1.0000 | TOPICAL_CREAM | Freq: Two times a day (BID) | CUTANEOUS | 0 refills | Status: DC

## 2022-01-04 MED ORDER — PREDNISONE 10 MG PO TABS: ORAL_TABLET | ORAL | 0 refills | Status: DC

## 2022-01-04 NOTE — Assessment & Plan Note (Signed)
Allergic in appearance and present on and off over past 3 months.  Recommend she continue Allegra when hives present.  Will send in Prednisone taper for current flare and Triamcinolone to use as needed.  Recommended she change detergents and no lotions with scent.  May need allergy testing if ongoing, discussed with patient.

## 2022-01-04 NOTE — Assessment & Plan Note (Signed)
Chronic, ongoing.  Continue current medication regimen and adjust as needed.  Thyroid labs today. 

## 2022-01-04 NOTE — Telephone Encounter (Signed)
Requested medication (s) are due for refill today: no  Requested medication (s) are on the active medication list: yes  Last refill:  01/04/22  Future visit scheduled: yes  Notes to clinic:  Unable to refill per protocol, last refill by provider 01/04/22 for 90 and 4 refills,possible duplicate.     Requested Prescriptions  Pending Prescriptions Disp Refills   levothyroxine (SYNTHROID) 50 MCG tablet [Pharmacy Med Name: LEVOTHYROXINE SODIUM 50 MCG TAB] 90 tablet 4    Sig: TAKE 1 TABLET BY MOUTH ONCE DAILY     Endocrinology:  Hypothyroid Agents Passed - 01/04/2022  9:58 AM      Passed - TSH in normal range and within 360 days    TSH  Date Value Ref Range Status  03/23/2021 2.880 0.450 - 4.500 uIU/mL Final         Passed - Valid encounter within last 12 months    Recent Outpatient Visits           Today Hypothyroidism, unspecified type   [facility], Dorie Rank, NP   9 months ago [place]   [facility], [place] T, NP   1 year ago Chronic right shoulder pain   [facility] [place], [place] T, NP   1 year ago Hypothyroidism, unspecified type   [facility] [place], [place] T, NP   1 year ago Hypothyroidism, unspecified type   [facility] [place], Dorie Rank, NP

## 2022-01-04 NOTE — Assessment & Plan Note (Signed)
Noted on past labs, recheck today and initiate medication as needed.  Continue focus on diet, patient is very active at baseline. The 10-year ASCVD risk score (Arnett DK, et al., 2019) is: 3.1%   Values used to calculate the score:     Age: 66 years     Sex: Female     Is Non-Hispanic African American: No     Diabetic: No     Tobacco smoker: No     Systolic Blood Pressure: 96 mmHg     Is BP treated: No     HDL Cholesterol: 66 mg/dL     Total Cholesterol: 217 mg/dL

## 2022-01-04 NOTE — Assessment & Plan Note (Signed)
Refuses flu, tetanus, and pneumococcal vaccinations.

## 2022-01-04 NOTE — Progress Notes (Signed)
BP 96/68 (BP Location: Left Arm, Patient Position: Sitting, Cuff Size: Normal)   Pulse (!) 56   Temp 98.2 F (36.8 C) (Oral)   Ht '5\' 3"'  (1.6 m)   Wt 131 lb 3.2 oz (59.5 kg)   SpO2 96%   BMI 23.24 kg/m    Subjective:    Patient ID: Ashley Lawrence, female    DOB: [birth year], 66 y.o.   MRN: [id number]  HPI: Ashley Lawrence is a 66 y.o. female presenting on 01/04/2022 for comprehensive medical examination. Current medical complaints include:none  She currently lives with: husband Menopausal Symptoms: no  HYPOTHYROIDISM Continues on Levothyroxine 50 MCG daily. Thyroid control status:stable Satisfied with current treatment? yes Medication side effects: yes Medication compliance: good compliance Etiology of hypothyroidism: unknown Recent dose adjustment:no Fatigue: no Cold intolerance: yes Heat intolerance: no Weight gain: no Weight loss: no Constipation: no Diarrhea/loose stools: no Palpitations: no Lower extremity edema: no Anxiety/depressed mood: no   OSTEOPENIA Osteopenia noted on DEXA on DEXA 12/30/2018 and continues supplements.  The BMD measured at Femur Neck Right is 0.733 g/cm2 with a T-score of -2.2. Satisfied with current treatment?: yes Adequate calcium & vitamin D: yes Weight bearing exercises: yes   RASH About 3 months started getting hives around her waistline -- will flare up. Has avoided placing lotion the past 3 days.   Duration:  months  Location: around waistline  Itching: yes Burning: no Redness: yes Oozing: no Scaling: no Blisters: no Painful: no Fevers: no Change in detergents/soaps/personal care products: no Recent illness: no Recent travel:no History of same: no Context: fluctuating Alleviating factors:  Allegra Hives Treatments attempted: Allegra Hives Shortness of breath: no  Throat/tongue swelling: no Myalgias/arthralgias: no      01/04/2022    8:08 AM 08/16/2020    9:00 AM 08/11/2019    8:17 AM  Depression screen PHQ 2/9  Decreased  Interest 0 0 0  Down, Depressed, Hopeless 0 0 0  PHQ - 2 Score 0 0 0  Altered sleeping 0 0   Tired, decreased energy 0 0   Change in appetite 0 0   Feeling bad or failure about yourself  0 0   Trouble concentrating 0 0   Moving slowly or fidgety/restless 0 0   Suicidal thoughts 0 0   PHQ-9 Score 0 0   Difficult doing work/chores Not difficult at all         08/11/2019    8:17 AM 01/04/2022    8:08 AM 01/04/2022    8:13 AM  Fall Risk  Falls in the past year? 0 0 0  Was there an injury with Fall? 0 0 0  Fall Risk Category Calculator 0 0 0  Fall Risk Category Low Low Low  Patient Fall Risk Level Low fall risk Low fall risk Low fall risk  Patient at Risk for Falls Due to  No Fall Risks No Fall Risks  Fall risk Follow up Falls evaluation completed Falls evaluation completed Falls evaluation completed    Functional Status Survey: Is the patient deaf or have difficulty hearing?: No Does the patient have difficulty seeing, even when wearing glasses/contacts?: No Does the patient have difficulty concentrating, remembering, or making decisions?: No Does the patient have difficulty walking or climbing stairs?: No Does the patient have difficulty dressing or bathing?: No Does the patient have difficulty doing errands alone such as visiting a doctor's office or shopping?: No   Past Medical History:  History reviewed. No pertinent past medical history.  Surgical History:  Past Surgical History:  Procedure Laterality Date   BREAST BIOPSY Left    neg   CHOLECYSTECTOMY     NM RENAL LASIX ([place] HX)     TUBAL LIGATION      Medications:  Current Outpatient Medications on File Prior to Visit  Medication Sig   calcium carbonate (OSCAL) 1500 (600 Ca) MG TABS tablet Take 600 mg by mouth 2 (two) times daily with a meal.   cholecalciferol (VITAMIN D3) 25 MCG (1000 UNIT) tablet Take 4,000 Units by mouth daily.   Multiple Vitamin (MULTIVITAMIN) tablet Take 1 tablet by mouth daily.   No  current facility-administered medications on file prior to visit.    Allergies:  No Known Allergies  Social History:  Social History   Socioeconomic History   Marital status: Unknown    Spouse name: Not on file   Number of children: Not on file   Years of education: Not on file   Highest education level: Not on file  Occupational History    Employer: [employer]    Comment: Administrator, sports  Tobacco Use   Smoking status: Never   Smokeless tobacco: Never  Vaping Use   Vaping Use: Never used  Substance and Sexual Activity   Alcohol use: Never   Drug use: Never   Sexual activity: Yes  Other Topics Concern   Not on file  Social History Narrative   Not on file   Social Determinants of Health   Financial Resource Strain: Low Risk  (07/07/2019)   Overall Financial Resource Strain (CARDIA)    Difficulty of Paying Living Expenses: Not hard at all  Food Insecurity: No Food Insecurity (07/07/2019)   Hunger Vital Sign    Worried About Running Out of Food in the Last Year: Never true    [place] in the Last Year: Never true  Transportation Needs: No Transportation Needs (07/07/2019)   PRAPARE - Hydrologist (Medical): No    Lack of Transportation (Non-Medical): No  Physical Activity: Sufficiently Active (07/07/2019)   Exercise Vital Sign    Days of Exercise per Week: 5 days    Minutes of Exercise per Session: 30 min  Stress: No Stress Concern Present (07/07/2019)   [place]    Feeling of Stress : Not at all  Social Connections: Moderately Integrated (07/07/2019)   Social Connection and Isolation Panel [NHANES]    Frequency of Communication with Friends and Family: Three times a week    Frequency of Social Gatherings with Friends and Family: Three times a week    Attends Religious Services: More than 4 times per year    Active Member of Clubs or Organizations: No    Attends Theatre manager Meetings: Never    Marital Status: Married  Human resources officer Violence: Not on file   Social History   Tobacco Use  Smoking Status Never  Smokeless Tobacco Never   Social History   Substance and Sexual Activity  Alcohol Use Never    Family History:  Family History  Problem Relation Age of Onset   Brain cancer Brother    Breast cancer Neg Hx     Past medical history, surgical history, medications, allergies, family history and social history reviewed with patient today and changes made to appropriate areas of the chart.   ROS All other ROS negative except what is listed above and in the HPI.  Objective:    BP 96/68 (BP Location: Left Arm, Patient Position: Sitting, Cuff Size: Normal)   Pulse (!) 56   Temp 98.2 F (36.8 C) (Oral)   Ht '5\' 3"'  (1.6 m)   Wt 131 lb 3.2 oz (59.5 kg)   SpO2 96%   BMI 23.24 kg/m   Wt Readings from Last 3 Encounters:  01/04/22 131 lb 3.2 oz (59.5 kg)  03/23/21 132 lb 9.6 oz (60.1 kg)  09/26/20 131 lb 12.8 oz (59.8 kg)    Physical Exam Vitals and nursing note reviewed. Exam conducted with a chaperone present.  Constitutional:      General: She is awake. She is not in acute distress.    Appearance: She is well-developed. She is not ill-appearing.  HENT:     Head: Normocephalic and atraumatic.     Right Ear: Hearing, tympanic membrane, ear canal and external ear normal. No drainage.     Left Ear: Hearing, tympanic membrane, ear canal and external ear normal. No drainage.     Nose: Nose normal.     Right Sinus: No maxillary sinus tenderness or frontal sinus tenderness.     Left Sinus: No maxillary sinus tenderness or frontal sinus tenderness.     Mouth/Throat:     Mouth: Mucous membranes are moist.     Pharynx: Oropharynx is clear. Uvula midline. No pharyngeal swelling, oropharyngeal exudate or posterior oropharyngeal erythema.  Eyes:     General: Lids are normal.        Right eye: No discharge.        Left eye: No  discharge.     Extraocular Movements: Extraocular movements intact.     Conjunctiva/sclera: Conjunctivae normal.     Pupils: Pupils are equal, round, and reactive to light.     Visual Fields: Right eye visual fields normal and left eye visual fields normal.  Neck:     Thyroid: No thyromegaly.     Vascular: No carotid bruit.     Trachea: Trachea normal.  Cardiovascular:     Rate and Rhythm: Normal rate and regular rhythm.     Heart sounds: Normal heart sounds. No murmur heard.    No gallop.  Pulmonary:     Effort: Pulmonary effort is normal. No accessory muscle usage or respiratory distress.     Breath sounds: Normal breath sounds.  Chest:  Breasts:    Right: Normal.     Left: Normal.  Abdominal:     General: Bowel sounds are normal.     Palpations: Abdomen is soft. There is no hepatomegaly or splenomegaly.     Tenderness: There is no abdominal tenderness.  Musculoskeletal:        General: Normal range of motion.     Cervical back: Normal range of motion and neck supple.     Right lower leg: No edema.     Left lower leg: No edema.  Lymphadenopathy:     Head:     Right side of head: No submental, submandibular, tonsillar, preauricular or posterior auricular adenopathy.     Left side of head: No submental, submandibular, tonsillar, preauricular or posterior auricular adenopathy.     Cervical: No cervical adenopathy.     Upper Body:     Right upper body: No supraclavicular, axillary or pectoral adenopathy.     Left upper body: No supraclavicular, axillary or pectoral adenopathy.  Skin:    General: Skin is warm and dry.     Capillary Refill: Capillary refill takes less  than 2 seconds.     Findings: Rash present. Rash is urticarial.     Comments: Rash wit urticarial appearance to waist line both sides.  Linear scratch marks present.  Flat and red in appearance.  No vesicles.   Neurological:     Mental Status: She is alert and oriented to person, place, and time.     Gait: Gait  is intact.     Deep Tendon Reflexes: Reflexes are normal and symmetric.     Reflex Scores:      Brachioradialis reflexes are 2+ on the right side and 2+ on the left side.      Patellar reflexes are 2+ on the right side and 2+ on the left side. Psychiatric:        Attention and Perception: Attention normal.        Mood and Affect: Mood normal.        Speech: Speech normal.        Behavior: Behavior normal. Behavior is cooperative.        Thought Content: Thought content normal.        Judgment: Judgment normal.     Results for orders placed or performed in visit on 03/23/21  CBC with Differential/Platelet  Result Value Ref Range   WBC 5.6 3.4 - 10.8 x10E3/uL   RBC 4.77 3.77 - 5.28 x10E6/uL   Hemoglobin 14.7 11.1 - 15.9 g/dL   Hematocrit 43.3 34.0 - 46.6 %   MCV 91 79 - 97 fL   MCH 30.8 26.6 - 33.0 pg   MCHC 33.9 31.5 - 35.7 g/dL   RDW 13.0 11.7 - 15.4 %   Platelets 221 150 - 450 x10E3/uL   Neutrophils 67 Not Estab. %   Lymphs 25 Not Estab. %   Monocytes 5 Not Estab. %   Eos 3 Not Estab. %   Basos 0 Not Estab. %   Neutrophils Absolute 3.7 1.4 - 7.0 x10E3/uL   Lymphocytes Absolute 1.4 0.7 - 3.1 x10E3/uL   Monocytes Absolute 0.3 0.1 - 0.9 x10E3/uL   EOS (ABSOLUTE) 0.1 0.0 - 0.4 x10E3/uL   Basophils Absolute 0.0 0.0 - 0.2 x10E3/uL   Immature Granulocytes 0 Not Estab. %   Immature Grans (Abs) 0.0 0.0 - 0.1 x10E3/uL  Comprehensive metabolic panel  Result Value Ref Range   Glucose 83 70 - 99 mg/dL   BUN 17 8 - 27 mg/dL   Creatinine, Ser 1.10 (H) 0.57 - 1.00 mg/dL   eGFR 56 (L) >59 mL/min/1.73   BUN/Creatinine Ratio 15 12 - 28   Sodium 144 134 - 144 mmol/L   Potassium 4.1 3.5 - 5.2 mmol/L   Chloride 104 96 - 106 mmol/L   CO2 25 20 - 29 mmol/L   Calcium 9.6 8.7 - 10.3 mg/dL   Total Protein 6.7 6.0 - 8.5 g/dL   Albumin 5.0 (H) 3.8 - 4.8 g/dL   Globulin, Total 1.7 1.5 - 4.5 g/dL   Albumin/Globulin Ratio 2.9 (H) 1.2 - 2.2   Bilirubin Total 0.7 0.0 - 1.2 mg/dL   Alkaline  Phosphatase 66 44 - 121 IU/L   AST 22 0 - 40 IU/L   ALT 11 0 - 32 IU/L  TSH  Result Value Ref Range   TSH 2.880 0.450 - 4.500 uIU/mL  T4, free  Result Value Ref Range   Free T4 1.42 0.82 - 1.77 ng/dL  Lead, blood (adult age 73 yrs or greater)  Result Value Ref Range   Lead-Whole Blood 1.3  0.0 - 3.4 ug/dL  PTH, Intact and Calcium  Result Value Ref Range   PTH 20 15 - 65 pg/mL   PTH Interp Comment   ANA w/Reflex if Positive  Result Value Ref Range   Anti Nuclear Antibody (ANA) Negative Negative  C-reactive protein  Result Value Ref Range   CRP <1 0 - 10 mg/L  Sed Rate (ESR)  Result Value Ref Range   Sed Rate 2 0 - 40 mm/hr  B12  Result Value Ref Range   Vitamin B-12 632 232 - 1,245 pg/mL  Zinc  Result Value Ref Range   Zinc 78 44 - 115 ug/dL  Thyroid peroxidase antibody  Result Value Ref Range   Thyroperoxidase Ab SerPl-aCnc 14 0 - 34 IU/mL      Assessment & Plan:   Problem List Items Addressed This Visit       Endocrine   Hypothyroid - Primary    Chronic, ongoing.  Continue current medication regimen and adjust as needed.  Thyroid labs today.      Relevant Medications   levothyroxine (SYNTHROID) 50 MCG tablet   Other Relevant Orders   TSH   T4, free     Musculoskeletal and Integument   Osteopenia of neck of right femur    Ongoing.  Noted on DEXA in 2020.  Continue on calcium and Vit D supplement daily.  Repeat DEXA in August 2025 (12/30/18 last).  Check Vit D level on labs.      Relevant Orders   VITAMIN D 25 Hydroxy (Vit-D Deficiency, Fractures)   Rash    Allergic in appearance and present on and off over past 3 months.  Recommend she continue Allegra when hives present.  Will send in Prednisone taper for current flare and Triamcinolone to use as needed.  Recommended she change detergents and no lotions with scent.  May need allergy testing if ongoing, discussed with patient.        Other   Elevated LDL cholesterol level    Noted on past labs, recheck  today and initiate medication as needed.  Continue focus on diet, patient is very active at baseline. The 10-year ASCVD risk score (Arnett DK, et al., 2019) is: 3.1%   Values used to calculate the score:     Age: 45 years     Sex: Female     Is Non-Hispanic African American: No     Diabetic: No     Tobacco smoker: No     Systolic Blood Pressure: 96 mmHg     Is BP treated: No     HDL Cholesterol: 66 mg/dL     Total Cholesterol: 217 mg/dL       Relevant Orders   Comprehensive metabolic panel   Lipid Panel w/o Chol/HDL Ratio   Refused influenza vaccine    Refuses flu, tetanus, and pneumococcal vaccinations.      Other Visit Diagnoses     Encounter for annual physical exam       Annual physical today with labs and health maintenance reviewed, discussed with patient.   Relevant Orders   CBC with Differential/Platelet        Follow up plan: Return in about 1 year (around 01/05/2023) for Annual physical.   LABORATORY TESTING:  - Pap smear: up to date  IMMUNIZATIONS:   - Tdap: Tetanus vaccination status reviewed: Refused. - Influenza: Refused - Pneumovax: Refused - Prevnar: Refused - COVID: Up to date - HPV: Not applicable - Shingrix vaccine: Refused  SCREENING: -  Mammogram: Up to date  - Colonoscopy: Up to date = Cologuard - Bone Density: Up to date  -Hearing Test: Not applicable  -Spirometry: Not applicable   PATIENT COUNSELING:   Advised to take 1 mg of folate supplement per day if capable of pregnancy.   Sexuality: Discussed sexually transmitted diseases, partner selection, use of condoms, avoidance of unintended pregnancy  and contraceptive alternatives.   Advised to avoid cigarette smoking.  I discussed with the patient that most people either abstain from alcohol or drink within safe limits (<=14/week and <=4 drinks/occasion for males, <=7/weeks and <= 3 drinks/occasion for females) and that the risk for alcohol disorders and other health effects rises  proportionally with the number of drinks per week and how often a drinker exceeds daily limits.  Discussed cessation/primary prevention of drug use and availability of treatment for abuse.   Diet: Encouraged to adjust caloric intake to maintain  or achieve ideal body weight, to reduce intake of dietary saturated fat and total fat, to limit sodium intake by avoiding high sodium foods and not adding table salt, and to maintain adequate dietary potassium and calcium preferably from fresh fruits, vegetables, and low-fat dairy products.    Stressed the importance of regular exercise  Injury prevention: Discussed safety belts, safety helmets, smoke detector, smoking near bedding or upholstery.   Dental health: Discussed importance of regular tooth brushing, flossing, and dental visits.    NEXT PREVENTATIVE PHYSICAL DUE IN 1 YEAR. Return in about 1 year (around 01/05/2023) for Annual physical.

## 2022-01-04 NOTE — Assessment & Plan Note (Signed)
Ongoing.  Noted on DEXA in 2020.  Continue on calcium and Vit D supplement daily.  Repeat DEXA in August 2025 (12/30/18 last).  Check Vit D level on labs.

## 2022-01-05 LAB — CBC WITH DIFFERENTIAL/PLATELET
Basophils Absolute: 0 10*3/uL (ref 0.0–0.2)
Basos: 1 %
EOS (ABSOLUTE): 0.1 10*3/uL (ref 0.0–0.4)
Eos: 3 %
Hematocrit: 43.6 % (ref 34.0–46.6)
Hemoglobin: 14.6 g/dL (ref 11.1–15.9)
Immature Grans (Abs): 0 10*3/uL (ref 0.0–0.1)
Immature Granulocytes: 0 %
Lymphocytes Absolute: 1.2 10*3/uL (ref 0.7–3.1)
Lymphs: 28 %
MCH: 30.8 pg (ref 26.6–33.0)
MCHC: 33.5 g/dL (ref 31.5–35.7)
MCV: 92 fL (ref 79–97)
Monocytes Absolute: 0.3 10*3/uL (ref 0.1–0.9)
Monocytes: 8 %
Neutrophils Absolute: 2.6 10*3/uL (ref 1.4–7.0)
Neutrophils: 60 %
Platelets: 218 10*3/uL (ref 150–450)
RBC: 4.74 x10E6/uL (ref 3.77–5.28)
RDW: 12.6 % (ref 11.7–15.4)
WBC: 4.3 10*3/uL (ref 3.4–10.8)

## 2022-01-05 LAB — COMPREHENSIVE METABOLIC PANEL
ALT: 13 IU/L (ref 0–32)
AST: 22 IU/L (ref 0–40)
Albumin/Globulin Ratio: 2.6 — ABNORMAL HIGH (ref 1.2–2.2)
Albumin: 4.6 g/dL (ref 3.9–4.9)
Alkaline Phosphatase: 69 IU/L (ref 44–121)
BUN/Creatinine Ratio: 18 (ref 12–28)
BUN: 17 mg/dL (ref 8–27)
Bilirubin Total: 0.6 mg/dL (ref 0.0–1.2)
CO2: 26 mmol/L (ref 20–29)
Calcium: 9.5 mg/dL (ref 8.7–10.3)
Chloride: 104 mmol/L (ref 96–106)
Creatinine, Ser: 0.97 mg/dL (ref 0.57–1.00)
Globulin, Total: 1.8 g/dL (ref 1.5–4.5)
Glucose: 80 mg/dL (ref 70–99)
Potassium: 4 mmol/L (ref 3.5–5.2)
Sodium: 143 mmol/L (ref 134–144)
Total Protein: 6.4 g/dL (ref 6.0–8.5)
eGFR: 65 mL/min/{1.73_m2} (ref >59)

## 2022-01-05 LAB — TSH: TSH: 1.2 u[IU]/mL (ref 0.450–4.500)

## 2022-01-05 LAB — LIPID PANEL W/O CHOL/HDL RATIO
Cholesterol, Total: 210 mg/dL — ABNORMAL HIGH (ref 100–199)
HDL: 70 mg/dL (ref >39)
LDL Chol Calc (NIH): 127 mg/dL — ABNORMAL HIGH (ref 0–99)
Triglycerides: 72 mg/dL (ref 0–149)
VLDL Cholesterol Cal: 13 mg/dL (ref 5–40)

## 2022-01-05 LAB — VITAMIN D 25 HYDROXY (VIT D DEFICIENCY, FRACTURES): Vit D, 25-Hydroxy: 66.2 ng/mL (ref 30.0–100.0)

## 2022-01-05 LAB — T4, FREE: Free T4: 1.66 ng/dL (ref 0.82–1.77)

## 2022-01-05 NOTE — Progress Notes (Signed)
Contacted via MyChart The 10-year ASCVD risk score (Arnett DK, et al., 2019) is: 3%   Values used to calculate the score:     Age: 66 years     Sex: Female     Is Non-Hispanic African American: No     Diabetic: No     Tobacco smoker: No     Systolic Blood Pressure: 96 mmHg     Is BP treated: No     HDL Cholesterol: 70 mg/dL     Total Cholesterol: 210 mg/dL   Good morning [place], your labs have returned and overall are stable with exception of cholesterol levels.  Your cholesterol is still high, but continued recommendations to make lifestyle changes. Your LDL is above normal. The LDL is the bad cholesterol. Over time and in combination with inflammation and other factors, this contributes to plaque which in turn may lead to stroke and/or heart attack down the road. Sometimes high LDL is primarily genetic, and people might be eating all the right foods but still have high numbers. Other times, there is room for improvement in one's diet and eating healthier can bring this number down and potentially reduce one's risk of heart attack and/or stroke.   To reduce your LDL, Remember - more fruits and vegetables, more fish, and limit red meat and dairy products. More soy, nuts, beans, barley, lentils, oats and plant sterol ester enriched margarine instead of butter. I also encourage eliminating sugar and processed food. Remember, shop on the outside of the grocery store and visit your [employer]. If you would like to talk with me about dietary changes for your cholesterol, please let me know. We should recheck your cholesterol in 12 months.  Any questions? Keep being wonderful!!  Thank you for allowing me to participate in your care.  I appreciate you. Kindest regards, Tehilla Coffel

## 2022-03-20 ENCOUNTER — Encounter: Payer: Self-pay | Admitting: Nurse Practitioner

## 2022-03-25 NOTE — Patient Instructions (Signed)
Rash, Adult  A rash is a change in the color of your skin. A rash can also change the way your skin feels. There are many different conditions and factors that can cause a rash. Follow these instructions at home: The goal of treatment is to stop the itching and keep the rash from spreading. Watch for any changes in your symptoms. Let your doctor know about them. Follow these instructions to help with your condition: Medicine Take or apply over-the-counter and prescription medicines only as told by your doctor. These may include medicines: To treat red or swollen skin (corticosteroid creams). To treat itching. To treat an allergy (oral antihistamines). To treat very bad symptoms (oral corticosteroids).  Skin care Put cool cloths (compresses) on the affected areas. Do not scratch or rub your skin. Avoid covering the rash. Make sure that the rash is exposed to air as much as possible. Managing itching and discomfort Avoid hot showers or baths. These can make itching worse. A cold shower may help. Try taking a bath with: Epsom salts. You can get these at your local pharmacy or grocery store. Follow the instructions on the package. Baking soda. Pour a small amount into the bath as told by your doctor. Colloidal oatmeal. You can get this at your local pharmacy or grocery store. Follow the instructions on the package. Try putting baking soda paste onto your skin. Stir water into baking soda until it gets like a paste. Try putting on a lotion that relieves itchiness (calamine lotion). Keep cool and out of the sun. Sweating and being hot can make itching worse. General instructions  Rest as needed. Drink enough fluid to keep your pee (urine) pale yellow. Wear loose-fitting clothing. Avoid scented soaps, detergents, and perfumes. Use gentle soaps, detergents, perfumes, and other cosmetic products. Avoid anything that causes your rash. Keep a journal to help track what causes your rash. Write  down: What you eat. What cosmetic products you use. What you drink. What you wear. This includes jewelry. Keep all follow-up visits as told by your doctor. This is important. Contact a doctor if: You sweat at night. You lose weight. You pee (urinate) more than normal. You pee less than normal, or you notice that your pee is a darker color than normal. You feel weak. You throw up (vomit). Your skin or the whites of your eyes look yellow (jaundice). Your skin: Tingles. Is numb. Your rash: Does not go away after a few days. Gets worse. You are: More thirsty than normal. More tired than normal. You have: New symptoms. Pain in your belly (abdomen). A fever. Watery poop (diarrhea). Get help right away if: You have a fever and your symptoms suddenly get worse. You start to feel mixed up (confused). You have a very bad headache or a stiff neck. You have very bad joint pains or stiffness. You have jerky movements that you cannot control (seizure). Your rash covers all or most of your body. The rash may or may not be painful. You have blisters that: Are on top of the rash. Grow larger. Grow together. Are painful. Are inside your nose or mouth. You have a rash that: Looks like purple pinprick-sized spots all over your body. Has a "bull's eye" or looks like a target. Is red and painful, causes your skin to peel, and is not from being in the sun too long. Summary A rash is a change in the color of your skin. A rash can also change the way your skin   feels. The goal of treatment is to stop the itching and keep the rash from spreading. Take or apply over-the-counter and prescription medicines only as told by your doctor. Contact a doctor if you have new symptoms or symptoms that get worse. Keep all follow-up visits as told by your doctor. This is important. This information is not intended to replace advice given to you by your health care provider. Make sure you discuss any  questions you have with your health care provider. Document Revised: 11/13/2021 Document Reviewed: 02/22/2021 Elsevier Patient Education  2023 [employer]  

## 2022-03-28 ENCOUNTER — Encounter: Payer: Self-pay | Admitting: Nurse Practitioner

## 2022-03-28 ENCOUNTER — Ambulatory Visit (INDEPENDENT_AMBULATORY_CARE_PROVIDER_SITE_OTHER): Payer: No Typology Code available for payment source | Admitting: Nurse Practitioner

## 2022-03-28 VITALS — BP 96/63 | HR 71 | Temp 98.7°F | Ht 63.0 in | Wt 131.3 lb

## 2022-03-28 DIAGNOSIS — H01134 Eczematous dermatitis of left upper eyelid: Secondary | ICD-10-CM

## 2022-03-28 DIAGNOSIS — H01131 Eczematous dermatitis of right upper eyelid: Secondary | ICD-10-CM

## 2022-03-28 MED ORDER — TRIAMCINOLONE ACETONIDE 0.025 % EX CREA: 1.0000 | TOPICAL_CREAM | Freq: Two times a day (BID) | CUTANEOUS | 2 refills | Status: DC

## 2022-03-28 NOTE — Progress Notes (Signed)
Acute Office Visit  Subjective:     Patient ID: Ashley Lawrence, female    DOB: 08/10/1955, 66 y.o.   MRN: [id number]  Chief Complaint  Patient presents with   Eye Problem    Patient says she has been getting red blotches around both of her eyelids. Patient says she has been using Neosporin and it seems to be helping after using it a week. Patient says she notices it comes and goes and has been for the past 3 years.    Has had this issue of and on for the past two years.  Red blotches around exterior eye and eyelid area. Comes and goes.  Does not get itchy or sore when appears.  Intermittent in nature.  Recently used Neosporin which offered some benefit.  Does have history of hives to abdomen in past.  Does see Dr. Ellin Mayhew on regular basis for eye exams.  Eye Problem  Both eyes are affected. This is a recurrent problem. The current episode started more than 1 year ago. The problem occurs intermittently. The problem has been unchanged. There was no injury mechanism. The pain is at a severity of 0/10. The patient is experiencing no pain. There is No known exposure to pink eye. She Does not wear contacts. Pertinent negatives include no blurred vision, eye discharge, double vision, eye redness, fever, foreign body sensation, itching or photophobia. Treatments tried: Neosporin. The treatment provided moderate relief.   Patient is in today for eye issues.  Review of Systems  Constitutional:  Negative for fever, malaise/fatigue and weight loss.  Eyes:  Negative for blurred vision, double vision, photophobia, discharge, redness and itching.  Respiratory: Negative.    Cardiovascular:  Negative for chest pain, palpitations and leg swelling.  Skin:  Positive for rash. Negative for itching.  Neurological: Negative.   Psychiatric/Behavioral: Negative.        Objective:    BP 96/63   Pulse 71   Temp 98.7 F (37.1 C) (Oral)   Ht 5\' 3"  (1.6 m)   Wt 131 lb 4.8 oz (59.6 kg)   BMI 23.26 kg/m  BP  Readings from Last 3 Encounters:  03/28/22 96/63  01/04/22 96/68  03/23/21 94/60   Wt Readings from Last 3 Encounters:  03/28/22 131 lb 4.8 oz (59.6 kg)  01/04/22 131 lb 3.2 oz (59.5 kg)  03/23/21 132 lb 9.6 oz (60.1 kg)   Physical Exam Vitals and nursing note reviewed.  Constitutional:      General: She is awake. She is not in acute distress.    Appearance: She is well-developed and well-groomed. She is not ill-appearing or toxic-appearing.  HENT:     Head: Normocephalic.     Right Ear: Hearing normal.     Left Ear: Hearing normal.     Nose: Nose normal.  Eyes:     General: Lids are everted, no foreign bodies appreciated.        Right eye: No discharge.        Left eye: No discharge.     Extraocular Movements: Extraocular movements intact.     Conjunctiva/sclera: Conjunctivae normal.     Pupils: Pupils are equal, round, and reactive to light.     Visual Fields: Right eye visual fields normal and left eye visual fields normal.     Comments: Eyelids bilaterally with small areas of healing patchy erythema with slight scaling noted.  To upper eyelid right eye and to exterior aspect both eyes.  Neck:  Vascular: No carotid bruit.  Cardiovascular:     Rate and Rhythm: Normal rate and regular rhythm.     Heart sounds: Normal heart sounds. No murmur heard.    No gallop.  Pulmonary:     Effort: Pulmonary effort is normal. No accessory muscle usage or respiratory distress.     Breath sounds: Normal breath sounds.  Abdominal:     General: Bowel sounds are normal.     Palpations: Abdomen is soft.  Musculoskeletal:     Cervical back: Normal range of motion and neck supple.     Right lower leg: No edema.     Left lower leg: No edema.  Skin:    General: Skin is warm and dry.  Neurological:     Mental Status: She is alert and oriented to person, place, and time.  Psychiatric:        Attention and Perception: Attention normal.        Mood and Affect: Mood normal.        Speech:  Speech normal.        Behavior: Behavior normal. Behavior is cooperative.        Thought Content: Thought content normal.    No results found for any visits on 03/28/22.     Assessment & Plan:   Problem List Items Addressed This Visit       Musculoskeletal and Integument   Eczematous dermatitis, upper eyelids, bilateral - Primary    Ongoing and intermittent for years.  Educated her on findings.  Will start low potency steroid, Triamcinolone 0.025% to use only as needed to exterior eyelids when flare present.  She is aware not to use daily and to stop when flare improved.  If worsening alert provider immediately and return to office.       Meds ordered this encounter  Medications   triamcinolone (KENALOG) 0.025 % cream    Sig: Apply 1 Application topically 2 (two) times daily.    Dispense:  30 g    Refill:  2    Return for as scheduled.  Marjie Skiff, NP

## 2022-03-28 NOTE — Assessment & Plan Note (Signed)
Ongoing and intermittent for years.  Educated her on findings.  Will start low potency steroid, Triamcinolone 0.025% to use only as needed to exterior eyelids when flare present.  She is aware not to use daily and to stop when flare improved.  If worsening alert provider immediately and return to office.

## 2022-06-02 ENCOUNTER — Encounter: Payer: Self-pay | Admitting: Nurse Practitioner

## 2022-06-03 ENCOUNTER — Ambulatory Visit: Payer: Self-pay | Admitting: *Deleted

## 2022-06-03 NOTE — Telephone Encounter (Signed)
Reason for Disposition  [1] SEVERE back pain (e.g., excruciating, unable to do any normal activities) AND [2] not improved 2 hours after pain medicine  Answer Assessment - Initial Assessment Questions 1. ONSET: "When did the pain begin?"      I'm having sciatic pain. 2. LOCATION: "Where does it hurt?" (upper, mid or lower back)     Yesterday I bent over and I felt a pop in my left lower back and it's hurting so bad since.   3. SEVERITY: "How bad is the pain?"  (e.g., Scale 1-10; mild, moderate, or severe)   - MILD (1-3): Doesn't interfere with normal activities.    - MODERATE (4-7): Interferes with normal activities or awakens from sleep.    - SEVERE (8-10): Excruciating pain, unable to do any normal activities.      10/10 4. PATTERN: "Is the pain constant?" (e.g., yes, no; constant, intermittent)      Severe    If I move it causes pain 5. RADIATION: "Does the pain shoot into your legs or somewhere else?"     I can walk around but it hurts. 6. CAUSE:  "What do you think is causing the back pain?"      It's sciatic  7. BACK OVERUSE:  "Any recent lifting of heavy objects, strenuous work or exercise?"     I bent over to pick up something and it popped in lower back. 8. MEDICINES: "What have you taken so far for the pain?" (e.g., nothing, acetaminophen, NSAIDS)     No 9. NEUROLOGIC SYMPTOMS: "Do you have any weakness, numbness, or problems with bowel/bladder control?"     No 10. OTHER SYMPTOMS: "Do you have any other symptoms?" (e.g., fever, abdomen pain, burning with urination, blood in urine)       No 11. PREGNANCY: "Is there any chance you are pregnant?" "When was your last menstrual period?"       Not asked  Protocols used: Back Pain-A-AH

## 2022-06-03 NOTE — Telephone Encounter (Signed)
  Chief Complaint: Sciatic pain in lower left back Symptoms: Bent over yesterday to pick up something and felt it pop in her lower back.   Been hurting since.   Any movement causes her pain.  Denies pain down either leg. Frequency: Since she bent over yesterday Pertinent Negatives: Patient denies not being able to walk.   Disposition: [] ED /[] Urgent Care (no appt availability in office) / [x] Appointment(In office/virtual)/ []  [facility] [facility]/ [] Home Care/ [] Refused Recommended Disposition /[]  Mobile Bus/ []  Follow-up with PCP Additional Notes: Appt. Made with Marnee Guarneri, NP for 06/04/2022 at 11:20.    Encouraged her to continue taking the ibuprofen since it is helping with the pain and to use heat or ice whichever felt best for her too.

## 2022-06-04 ENCOUNTER — Ambulatory Visit: Payer: No Typology Code available for payment source | Admitting: Nurse Practitioner

## 2022-07-04 ENCOUNTER — Other Ambulatory Visit: Payer: Self-pay | Admitting: Nurse Practitioner

## 2022-07-05 NOTE — Telephone Encounter (Signed)
Requested medications are due for refill today.  Provider to determine  Requested medications are on the active medications list.  yes  Last refill. 03/28/2022 30g 2 rf  Future visit scheduled.   no  Notes to clinic.  Refill not delegated.    Requested Prescriptions  Pending Prescriptions Disp Refills   triamcinolone (KENALOG) 0.025 % cream [Pharmacy Med Name: TRIAMCINOLONE ACETON 0.025% TOP CRE] 30 g 2    Sig: APPLY TO AFFECTED AREA(s) TWICE DAILY     Not Delegated - Dermatology:  Corticosteroids Failed - 07/04/2022  3:43 PM      Failed - This refill cannot be delegated      Passed - Valid encounter within last 12 months    Recent Outpatient Visits           3 months ago Eczematous dermatitis, upper eyelids, bilateral   [place], Henrine Screws T, NP   6 months ago Hypothyroidism, unspecified type   [place], Barbaraann Faster, NP   1 year ago [place], Henrine Screws T, NP   1 year ago Chronic right shoulder pain   [place], Henrine Screws T, NP   1 year ago Hypothyroidism, unspecified type   [place], Barbaraann Faster, NP

## 2023-01-08 ENCOUNTER — Other Ambulatory Visit: Payer: Self-pay | Admitting: Nurse Practitioner

## 2023-01-09 ENCOUNTER — Other Ambulatory Visit: Payer: Self-pay | Admitting: Nurse Practitioner

## 2023-01-09 NOTE — Telephone Encounter (Signed)
Requested Prescriptions  Pending Prescriptions Disp Refills   levothyroxine (SYNTHROID) 50 MCG tablet [Pharmacy Med Name: LEVOTHYROXINE SODIUM 50 MCG TAB] 90 tablet 4    Sig: TAKE 1 TABLET BY MOUTH ONCE DAILY ON AN EMPTY STOMACH. WAIT 30 MINUTES BEFORE TAKING OTHER MEDS.     Endocrinology:  Hypothyroid Agents Failed - 01/08/2023 10:20 AM      Failed - TSH in normal range and within 360 days    TSH  Date Value Ref Range Status  01/04/2022 1.200 0.450 - 4.500 uIU/mL Final         Passed - Valid encounter within last 12 months    Recent Outpatient Visits           9 months ago Eczematous dermatitis, upper eyelids, bilateral   [place] [facility] [place], Corrie Dandy T, NP   1 year ago Hypothyroidism, unspecified type   [facility] [place], Dorie Rank, NP   1 year ago [place]   [facility] [place], Corrie Dandy T, NP   2 years ago Chronic right shoulder pain   [place] [facility] [place], Corrie Dandy T, NP   2 years ago Hypothyroidism, unspecified type   [place] [facility] [place], Dorie Rank, NP       Future Appointments             In 1 month [place], Dorie Rank, NP [place] [facility], [facility]

## 2023-01-09 NOTE — Telephone Encounter (Signed)
Medication Refill - Medication: [facility] calling to request refill for levothyroxine (SYNTHROID) 50 MCG tablet [[id number]] received message refill too soon. Last refill was 12/04/22 out of refills.  Has the patient contacted their pharmacy? Yes.   (Agent: If no, request that the patient contact the pharmacy for the refill. If patient does not wish to contact the pharmacy document the reason why and proceed with request.) (Agent: If yes, when and what did the pharmacy advise?)  Preferred Pharmacy (with phone number or street name):  [facility] - [place], [place] - [address] Phone: [phone]  Fax: [phone]     Has the patient been seen for an appointment in the last year OR does the patient have an upcoming appointment? Yes.    Agent: Please be advised that RX refills may take up to 3 business days. We ask that you follow-up with your pharmacy.

## 2023-01-10 MED ORDER — LEVOTHYROXINE SODIUM 50 MCG PO TABS: 50.0000 ug | ORAL_TABLET | Freq: Every day | ORAL | 4 refills | Status: DC

## 2023-01-10 NOTE — Telephone Encounter (Signed)
Requested medications are due for refill today.  yes  Requested medications are on the active medications list.  yes  Last refill. 12/04/2021 #90 4 rf  Future visit scheduled.   yes  Notes to clinic.  Labs are expired.    Requested Prescriptions  Pending Prescriptions Disp Refills   levothyroxine (SYNTHROID) 50 MCG tablet 90 tablet 4    Sig: Take 1 tablet (50 mcg total) by mouth daily.     Endocrinology:  Hypothyroid Agents Failed - 01/09/2023  3:54 PM      Failed - TSH in normal range and within 360 days    TSH  Date Value Ref Range Status  01/04/2022 1.200 0.450 - 4.500 uIU/mL Final         Passed - Valid encounter within last 12 months    Recent Outpatient Visits           9 months ago Eczematous dermatitis, upper eyelids, bilateral   [place] [facility] [place], Corrie Dandy T, NP   1 year ago Hypothyroidism, unspecified type   [place] [facility] [place], Dorie Rank, NP   1 year ago [place]   [place] [facility] [place], Corrie Dandy T, NP   2 years ago Chronic right shoulder pain   [place] [facility] [place], Corrie Dandy T, NP   2 years ago Hypothyroidism, unspecified type   [place] [facility] [place], Dorie Rank, NP       Future Appointments             In 1 month Cannady, Dorie Rank, NP [place] [facility], [facility]

## 2023-02-13 ENCOUNTER — Encounter: Payer: No Typology Code available for payment source | Admitting: Nurse Practitioner

## 2023-02-23 NOTE — Patient Instructions (Incomplete)
Please call to schedule your mammogram and/or bone density: [facility]  Address: [address], [place], [place] [address] Phone: [phone]  [facility] [address] [place],  [place]  [address] Phone: [phone]    Be Involved in Caring For Your Health:  Taking Medications When medications are taken as directed, they can greatly improve your health. But if they are not taken as prescribed, they may not work. In some cases, not taking them correctly can be harmful. To help ensure your treatment remains effective and safe, understand your medications and how to take them. Bring your medications to each visit for review by your provider.  Your lab results, notes, and after visit summary will be available on My Chart. We strongly encourage you to use this feature. If lab results are abnormal the clinic will contact you with the appropriate steps. If the clinic does not contact you assume the results are satisfactory. You can always view your results on My Chart. If you have questions regarding your health or results, please contact the clinic during office hours. You can also ask questions on My Chart.  We at [facility] are grateful that you chose [place] to provide your care. We strive to provide evidence-based and compassionate care and are always looking for feedback. If you get a survey from the clinic please complete this so we can hear your opinions.  Hypothyroidism  Hypothyroidism is when the thyroid gland does not make enough of certain hormones. This is called an underactive thyroid. The thyroid gland is a small gland located in the lower front part of the neck, just in front of the windpipe (trachea). This gland makes hormones that help control how the body uses food for energy (metabolism) as well as how the heart and brain function. These hormones also play a role in keeping your bones  strong. When the thyroid is underactive, it produces too little of the hormones thyroxine (T4) and triiodothyronine (T3). What are the causes? This condition may be caused by: Hashimoto's disease. This is a disease in which the body's disease-fighting system (immune system) attacks the thyroid gland. This is the most common cause. Viral infections. Pregnancy. Certain medicines. Birth defects. Problems with a gland in the center of the brain (pituitary gland). Lack of enough iodine in the diet. Other causes may include: Past radiation treatments to the head or neck for cancer. Past treatment with radioactive iodine. Past exposure to radiation in the environment. Past surgical removal of part or all of the thyroid. What increases the risk? You are more likely to develop this condition if: You are female. You have a family history of thyroid conditions. You use a medicine called lithium. You take medicines that affect the immune system (immunosuppressants). What are the signs or symptoms? Common symptoms of this condition include: Not being able to tolerate cold. Feeling as though you have no energy (lethargy). Lack of appetite. Constipation. Sadness or depression. Weight gain that is not explained by a change in diet or exercise habits. Menstrual irregularity. Dry skin, coarse hair, or brittle nails. Other symptoms may include: Muscle pain. Slowing of thought processes. Poor memory. How is this diagnosed? This condition may be diagnosed based on: Your symptoms, your medical history, and a physical exam. Blood tests. You may also have imaging tests, such as an ultrasound or MRI. How is this treated? This condition is treated with medicine that replaces the  thyroid hormones that your body does not make. After you begin treatment, it may take several weeks for symptoms to go away. Follow these instructions at home: Take over-the-counter and prescription medicines only as told by  your health care provider. If you start taking any new medicines, tell your health care provider. Keep all follow-up visits as told by your health care provider. This is important. As your condition improves, your dosage of thyroid hormone medicine may change. You will need to have blood tests regularly so that your health care provider can monitor your condition. Contact a health care provider if: Your symptoms do not get better with treatment. You are taking thyroid hormone replacement medicine and you: Sweat a lot. Have tremors. Feel anxious. Lose weight rapidly. Cannot tolerate heat. Have emotional swings. Have diarrhea. Feel weak. Get help right away if: You have chest pain. You have an irregular heartbeat. You have a rapid heartbeat. You have difficulty breathing. These symptoms may be an emergency. Get help right away. Call 911. Do not wait to see if the symptoms will go away. Do not drive yourself to the hospital. Summary Hypothyroidism is when the thyroid gland does not make enough of certain hormones (it is underactive). When the thyroid is underactive, it produces too little of the hormones thyroxine (T4) and triiodothyronine (T3). The most common cause is Hashimoto's disease, a disease in which the body's disease-fighting system (immune system) attacks the thyroid gland. The condition can also be caused by viral infections, medicine, pregnancy, or past radiation treatment to the head or neck. Symptoms may include weight gain, dry skin, constipation, feeling as though you do not have energy, and not being able to tolerate cold. This condition is treated with medicine to replace the thyroid hormones that your body does not make. This information is not intended to replace advice given to you by your health care provider. Make sure you discuss any questions you have with your health care provider. Document Revised: 05/15/2021 Document Reviewed: 05/15/2021 Elsevier Patient  Education  2024 [employer].

## 2023-02-27 ENCOUNTER — Encounter: Payer: Self-pay | Admitting: Nurse Practitioner

## 2023-02-27 ENCOUNTER — Ambulatory Visit: Payer: 59 | Admitting: Nurse Practitioner

## 2023-02-27 VITALS — BP 98/62 | HR 65 | Temp 98.7°F | Resp 18 | Ht 63.0 in | Wt 133.6 lb

## 2023-02-27 DIAGNOSIS — E039 Hypothyroidism, unspecified: Secondary | ICD-10-CM

## 2023-02-27 DIAGNOSIS — Z2821 Immunization not carried out because of patient refusal: Secondary | ICD-10-CM

## 2023-02-27 DIAGNOSIS — M85851 Other specified disorders of bone density and structure, right thigh: Secondary | ICD-10-CM

## 2023-02-27 DIAGNOSIS — Z0000 Encounter for general adult medical examination without abnormal findings: Secondary | ICD-10-CM | POA: Diagnosis not present

## 2023-02-27 DIAGNOSIS — Z1231 Encounter for screening mammogram for malignant neoplasm of breast: Secondary | ICD-10-CM

## 2023-02-27 DIAGNOSIS — E7800 Pure hypercholesterolemia, unspecified: Secondary | ICD-10-CM

## 2023-02-27 MED ORDER — LEVOTHYROXINE SODIUM 50 MCG PO TABS: 50.0000 ug | ORAL_TABLET | Freq: Every day | ORAL | 4 refills | Status: DC

## 2023-02-27 NOTE — Progress Notes (Signed)
BP 98/62 (BP Location: Left Arm)   Pulse 65   Temp 98.7 F (37.1 C) (Oral)   Resp 18   Ht 5\' 3"  (1.6 m)   Wt 133 lb 9.6 oz (60.6 kg)   SpO2 97%   BMI 23.67 kg/m    Subjective:    Patient ID: Ashley Lawrence, female    DOB: 09-Mar-1956, 67 y.o.   MRN: [id number]  HPI: Ashley Lawrence is a 67 y.o. female presenting on 02/27/2023 for comprehensive medical examination and Medicare Annual Exam. Current medical complaints include:none  She currently lives with: husband Menopausal Symptoms: no  The 10-year ASCVD risk score (Arnett DK, et al., 2019) is: 3.9%   Values used to calculate the score:     Age: 58 years     Sex: Female     Is Non-Hispanic African American: No     Diabetic: No     Tobacco smoker: No     Systolic Blood Pressure: 98 mmHg     Is BP treated: No     HDL Cholesterol: 70 mg/dL     Total Cholesterol: 210 mg/dL  HYPOTHYROIDISM Continues on Levothyroxine 50 MCG daily. Thyroid control status:stable Satisfied with current treatment? yes Medication side effects: yes Medication compliance: good compliance Etiology of hypothyroidism: unknown Recent dose adjustment:no Fatigue: no Cold intolerance: yes Heat intolerance: no Weight gain: no Weight loss: no Constipation: no Diarrhea/loose stools: no Palpitations: no Lower extremity edema: no Anxiety/depressed mood: no   OSTEOPENIA Osteopenia noted on DEXA 12/30/2018 and continues supplements.  The BMD measured at Femur Neck Right is 0.733 g/cm2 with a T-score of -2.2. Satisfied with current treatment?: yes Adequate calcium & vitamin D: yes Weight bearing exercises: yes      02/27/2023    9:42 AM 01/04/2022    8:08 AM 08/16/2020    9:00 AM 08/11/2019    8:17 AM  Depression screen PHQ 2/9  Decreased Interest 0 0 0 0  Down, Depressed, Hopeless 0 0 0 0  PHQ - 2 Score 0 0 0 0  Altered sleeping 0 0 0   Tired, decreased energy 0 0 0   Change in appetite 0 0 0   Feeling bad or failure about yourself  0 0 0   Trouble  concentrating 0 0 0   Moving slowly or fidgety/restless 0 0 0   Suicidal thoughts 0 0 0   PHQ-9 Score 0 0 0   Difficult doing work/chores Not difficult at all Not difficult at all         02/27/2023    9:42 AM 01/04/2022    8:09 AM  GAD 7 : Generalized Anxiety Score  Nervous, Anxious, on Edge 0 0  Control/stop worrying 0 0  Worry too much - different things 0 0  Trouble relaxing 0 0  Restless 0 0  Easily annoyed or irritable 0 0  Afraid - awful might happen 0 0  Total GAD 7 Score 0 0  Anxiety Difficulty Not difficult at all Not difficult at all      08/11/2019    8:17 AM 01/04/2022    8:08 AM 01/04/2022    8:13 AM 02/27/2023    9:42 AM  Fall Risk  Falls in the past year? 0 0 0 0  Was there an injury with Fall? 0 0 0 0  Fall Risk Category Calculator 0 0 0 0  Fall Risk Category (Retired) Low Low Low   (RETIRED) Patient Fall Risk Level Low fall risk  Low fall risk Low fall risk   Patient at Risk for Falls Due to  No Fall Risks No Fall Risks No Fall Risks  Fall risk Follow up Falls evaluation completed Falls evaluation completed Falls evaluation completed Education provided    Functional Status Survey: Is the patient deaf or have difficulty hearing?: No Does the patient have difficulty seeing, even when wearing glasses/contacts?: No Does the patient have difficulty concentrating, remembering, or making decisions?: No Does the patient have difficulty walking or climbing stairs?: No Does the patient have difficulty dressing or bathing?: No Does the patient have difficulty doing errands alone such as visiting a doctor's office or shopping?: No   Past Medical History:  History reviewed. No pertinent past medical history.  Surgical History:  Past Surgical History:  Procedure Laterality Date   BREAST BIOPSY Left    neg   CHOLECYSTECTOMY     NM RENAL LASIX ([facility] HX)     TUBAL LIGATION      Medications:  Current Outpatient Medications on File Prior to Visit  Medication Sig    calcium carbonate (OSCAL) 1500 (600 Ca) MG TABS tablet Take 600 mg by mouth 2 (two) times daily with a meal.   cholecalciferol (VITAMIN D3) 25 MCG (1000 UNIT) tablet Take 4,000 Units by mouth daily.   Multiple Vitamin (MULTIVITAMIN) tablet Take 1 tablet by mouth daily.   triamcinolone (KENALOG) 0.025 % cream APPLY TO AFFECTED AREA(s) TWICE DAILY (Patient not taking: Reported on 02/27/2023)   No current facility-administered medications on file prior to visit.    Allergies:  No Known Allergies  Social History:  Social History   Socioeconomic History   Marital status: Unknown    Spouse name: Not on file   Number of children: Not on file   Years of education: Not on file   Highest education level: Not on file  Occupational History    Employer: [employer]    Comment: Print production planner  Tobacco Use   Smoking status: Never   Smokeless tobacco: Never  Vaping Use   Vaping status: Never Used  Substance and Sexual Activity   Alcohol use: Never   Drug use: Never   Sexual activity: Yes  Other Topics Concern   Not on file  Social History Narrative   Not on file   Social Determinants of Health   Financial Resource Strain: Low Risk  (02/27/2023)   Overall Financial Resource Strain (CARDIA)    Difficulty of Paying Living Expenses: Not hard at all  Food Insecurity: No Food Insecurity (02/27/2023)   Hunger Vital Sign    Worried About Running Out of Food in the Last Year: Never true    Ran Out of Food in the Last Year: Never true  Transportation Needs: No Transportation Needs (02/27/2023)   PRAPARE - Administrator, Civil Service (Medical): No    Lack of Transportation (Non-Medical): No  Physical Activity: Sufficiently Active (02/27/2023)   Exercise Vital Sign    Days of Exercise per Week: 5 days    Minutes of Exercise per Session: 30 min  Stress: No Stress Concern Present (02/27/2023)   [employer] of Occupational Health - Occupational Stress Questionnaire    Feeling of Stress  : Not at all  Social Connections: Socially Integrated (02/27/2023)   Social Connection and Isolation Panel [NHANES]    Frequency of Communication with Friends and Family: More than three times a week    Frequency of Social Gatherings with Friends and Family: More than three  times a week    Attends Religious Services: More than 4 times per year    Active Member of Clubs or Organizations: Yes    Attends Banker Meetings: Never    Marital Status: Married  Catering manager Violence: Not At Risk (02/27/2023)   Humiliation, Afraid, Rape, and Kick questionnaire    Fear of Current or Ex-Partner: No    Emotionally Abused: No    Physically Abused: No    Sexually Abused: No   Social History   Tobacco Use  Smoking Status Never  Smokeless Tobacco Never   Social History   Substance and Sexual Activity  Alcohol Use Never    Family History:  Family History  Problem Relation Age of Onset   Brain cancer Brother    Breast cancer Neg Hx     Past medical history, surgical history, medications, allergies, family history and social history reviewed with patient today and changes made to appropriate areas of the chart.   ROS All other ROS negative except what is listed above and in the HPI.      Objective:    BP 98/62 (BP Location: Left Arm)   Pulse 65   Temp 98.7 F (37.1 C) (Oral)   Resp 18   Ht 5\' 3"  (1.6 m)   Wt 133 lb 9.6 oz (60.6 kg)   SpO2 97%   BMI 23.67 kg/m   Wt Readings from Last 3 Encounters:  02/27/23 133 lb 9.6 oz (60.6 kg)  03/28/22 131 lb 4.8 oz (59.6 kg)  01/04/22 131 lb 3.2 oz (59.5 kg)    Physical Exam Vitals and nursing note reviewed. Exam conducted with a chaperone present.  Constitutional:      General: She is awake. She is not in acute distress.    Appearance: She is well-developed and well-groomed. She is not ill-appearing or toxic-appearing.  HENT:     Head: Normocephalic and atraumatic.     Right Ear: Hearing, tympanic membrane, ear canal  and external ear normal. No drainage.     Left Ear: Hearing, tympanic membrane, ear canal and external ear normal. No drainage.     Nose: Nose normal.     Right Sinus: No maxillary sinus tenderness or frontal sinus tenderness.     Left Sinus: No maxillary sinus tenderness or frontal sinus tenderness.     Mouth/Throat:     Mouth: Mucous membranes are moist.     Pharynx: Oropharynx is clear. Uvula midline. No pharyngeal swelling, oropharyngeal exudate or posterior oropharyngeal erythema.  Eyes:     General: Lids are normal.        Right eye: No discharge.        Left eye: No discharge.     Extraocular Movements: Extraocular movements intact.     Conjunctiva/sclera: Conjunctivae normal.     Pupils: Pupils are equal, round, and reactive to light.     Visual Fields: Right eye visual fields normal and left eye visual fields normal.  Neck:     Thyroid: No thyromegaly.     Vascular: No carotid bruit.     Trachea: Trachea normal.  Cardiovascular:     Rate and Rhythm: Normal rate and regular rhythm.     Heart sounds: Normal heart sounds. No murmur heard.    No gallop.  Pulmonary:     Effort: Pulmonary effort is normal. No accessory muscle usage or respiratory distress.     Breath sounds: Normal breath sounds.  Chest:  Breasts:  Right: Normal.     Left: Normal.  Abdominal:     General: Bowel sounds are normal.     Palpations: Abdomen is soft. There is no hepatomegaly or splenomegaly.     Tenderness: There is no abdominal tenderness.  Musculoskeletal:        General: Normal range of motion.     Cervical back: Normal range of motion and neck supple.     Right lower leg: No edema.     Left lower leg: No edema.  Lymphadenopathy:     Head:     Right side of head: No submental, submandibular, tonsillar, preauricular or posterior auricular adenopathy.     Left side of head: No submental, submandibular, tonsillar, preauricular or posterior auricular adenopathy.     Cervical: No cervical  adenopathy.     Upper Body:     Right upper body: No supraclavicular, axillary or pectoral adenopathy.     Left upper body: No supraclavicular, axillary or pectoral adenopathy.  Skin:    General: Skin is warm and dry.     Capillary Refill: Capillary refill takes less than 2 seconds.     Findings: No rash.  Neurological:     Mental Status: She is alert and oriented to person, place, and time.     Gait: Gait is intact.     Deep Tendon Reflexes: Reflexes are normal and symmetric.     Reflex Scores:      Brachioradialis reflexes are 2+ on the right side and 2+ on the left side.      Patellar reflexes are 2+ on the right side and 2+ on the left side. Psychiatric:        Attention and Perception: Attention normal.        Mood and Affect: Mood normal.        Speech: Speech normal.        Behavior: Behavior normal. Behavior is cooperative.        Thought Content: Thought content normal.        Judgment: Judgment normal.       02/27/2023    9:45 AM  6CIT Screen  What Year? 0 points  What month? 0 points  What time? 0 points  Count back from 20 0 points  Months in reverse 0 points  Repeat phrase 0 points  Total Score 0 points   Results for orders placed or performed in visit on 01/04/22  CBC with Differential/Platelet  Result Value Ref Range   WBC 4.3 3.4 - 10.8 x10E3/uL   RBC 4.74 3.77 - 5.28 x10E6/uL   Hemoglobin 14.6 11.1 - 15.9 g/dL   Hematocrit 16.1 09.6 - 46.6 %   MCV 92 79 - 97 fL   MCH 30.8 26.6 - 33.0 pg   MCHC 33.5 31.5 - 35.7 g/dL   RDW 04.5 40.9 - 81.1 %   Platelets 218 150 - 450 x10E3/uL   Neutrophils 60 Not Estab. %   Lymphs 28 Not Estab. %   Monocytes 8 Not Estab. %   Eos 3 Not Estab. %   Basos 1 Not Estab. %   Neutrophils Absolute 2.6 1.4 - 7.0 x10E3/uL   Lymphocytes Absolute 1.2 0.7 - 3.1 x10E3/uL   Monocytes Absolute 0.3 0.1 - 0.9 x10E3/uL   EOS (ABSOLUTE) 0.1 0.0 - 0.4 x10E3/uL   Basophils Absolute 0.0 0.0 - 0.2 x10E3/uL   Immature Granulocytes 0 Not  Estab. %   Immature Grans (Abs) 0.0 0.0 - 0.1 x10E3/uL  Comprehensive  metabolic panel  Result Value Ref Range   Glucose 80 70 - 99 mg/dL   BUN 17 8 - 27 mg/dL   Creatinine, Ser 1.61 0.57 - 1.00 mg/dL   eGFR 65 >09 UE/AVW/0.98   BUN/Creatinine Ratio 18 12 - 28   Sodium 143 134 - 144 mmol/L   Potassium 4.0 3.5 - 5.2 mmol/L   Chloride 104 96 - 106 mmol/L   CO2 26 20 - 29 mmol/L   Calcium 9.5 8.7 - 10.3 mg/dL   Total Protein 6.4 6.0 - 8.5 g/dL   Albumin 4.6 3.9 - 4.9 g/dL   Globulin, Total 1.8 1.5 - 4.5 g/dL   Albumin/Globulin Ratio 2.6 (H) 1.2 - 2.2   Bilirubin Total 0.6 0.0 - 1.2 mg/dL   Alkaline Phosphatase 69 44 - 121 IU/L   AST 22 0 - 40 IU/L   ALT 13 0 - 32 IU/L  Lipid Panel w/o Chol/HDL Ratio  Result Value Ref Range   Cholesterol, Total 210 (H) 100 - 199 mg/dL   Triglycerides 72 0 - 149 mg/dL   HDL 70 >11 mg/dL   VLDL Cholesterol Cal 13 5 - 40 mg/dL   LDL Chol Calc (NIH) 914 (H) 0 - 99 mg/dL  TSH  Result Value Ref Range   TSH 1.200 0.450 - 4.500 uIU/mL  VITAMIN D 25 Hydroxy (Vit-D Deficiency, Fractures)  Result Value Ref Range   Vit D, 25-Hydroxy 66.2 30.0 - 100.0 ng/mL  T4, free  Result Value Ref Range   Free T4 1.66 0.82 - 1.77 ng/dL      Assessment & Plan:   Problem List Items Addressed This Visit       Endocrine   Hypothyroid    Chronic, ongoing.  Continue current medication regimen and adjust as needed.  Thyroid labs today.      Relevant Medications   levothyroxine (SYNTHROID) 50 MCG tablet   Other Relevant Orders   T4, free   CBC with Differential/Platelet   TSH     Musculoskeletal and Integument   Osteopenia of neck of right femur    Ongoing.  Noted on DEXA in 2020.  Continue on calcium and Vit D supplement daily.  Repeat DEXA in August 2025 (12/30/18 last).  Check Vit D level on labs.      Relevant Orders   VITAMIN D 25 Hydroxy (Vit-D Deficiency, Fractures)     Other   Elevated LDL cholesterol level    Noted on past labs, recheck today and  initiate medication as needed.  Continue focus on diet, patient is very active at baseline. The 10-year ASCVD risk score (Arnett DK, et al., 2019) is: 3.9%   Values used to calculate the score:     Age: 32 years     Sex: Female     Is Non-Hispanic African American: No     Diabetic: No     Tobacco smoker: No     Systolic Blood Pressure: 98 mmHg     Is BP treated: No     HDL Cholesterol: 70 mg/dL     Total Cholesterol: 210 mg/dL       Relevant Orders   Comprehensive metabolic panel   Lipid Panel w/o Chol/HDL Ratio   Refused influenza vaccine   Other Visit Diagnoses     Medicare annual wellness visit, initial    -  Primary   Medicare wellness due and performed with patient today.   Encounter for screening mammogram for malignant neoplasm of breast  Mammogram ordered.   Relevant Orders   MM 3D SCREENING MAMMOGRAM BILATERAL BREAST   Encounter for annual physical exam       Annual physical today with labs and health maintenance reviewed, discussed with patient.        Follow up plan: Return in about 1 year (around 02/27/2024) for Annual Exam.   LABORATORY TESTING:  - Pap smear: up to date  IMMUNIZATIONS:   - Tdap: Tetanus vaccination status reviewed: Refused. - Influenza: Refused - Pneumovax: Refused - Prevnar: Refused - COVID: Up to date - HPV: Not applicable - Shingrix vaccine: Refused  SCREENING: -Mammogram: Ordered today - Colonoscopy: Up to date = Cologuard due next 08/22/23 - Bone Density: Up to date due in 2025 -Hearing Test: Not applicable  -Spirometry: Not applicable   PATIENT COUNSELING:   Advised to take 1 mg of folate supplement per day if capable of pregnancy.   Sexuality: Discussed sexually transmitted diseases, partner selection, use of condoms, avoidance of unintended pregnancy  and contraceptive alternatives.   Advised to avoid cigarette smoking.  I discussed with the patient that most people either abstain from alcohol or drink within  safe limits (<=14/week and <=4 drinks/occasion for males, <=7/weeks and <= 3 drinks/occasion for females) and that the risk for alcohol disorders and other health effects rises proportionally with the number of drinks per week and how often a drinker exceeds daily limits.  Discussed cessation/primary prevention of drug use and availability of treatment for abuse.   Diet: Encouraged to adjust caloric intake to maintain  or achieve ideal body weight, to reduce intake of dietary saturated fat and total fat, to limit sodium intake by avoiding high sodium foods and not adding table salt, and to maintain adequate dietary potassium and calcium preferably from fresh fruits, vegetables, and low-fat dairy products.    Stressed the importance of regular exercise  Injury prevention: Discussed safety belts, safety helmets, smoke detector, smoking near bedding or upholstery.   Dental health: Discussed importance of regular tooth brushing, flossing, and dental visits.    NEXT PREVENTATIVE PHYSICAL DUE IN 1 YEAR. Return in about 1 year (around 02/27/2024) for Annual Exam.

## 2023-02-27 NOTE — Assessment & Plan Note (Signed)
Noted on past labs, recheck today and initiate medication as needed.  Continue focus on diet, patient is very active at baseline. The 10-year ASCVD risk score (Arnett DK, et al., 2019) is: 3.9%   Values used to calculate the score:     Age: 67 years     Sex: Female     Is Non-Hispanic African American: No     Diabetic: No     Tobacco smoker: No     Systolic Blood Pressure: 98 mmHg     Is BP treated: No     HDL Cholesterol: 70 mg/dL     Total Cholesterol: 210 mg/dL

## 2023-02-27 NOTE — Assessment & Plan Note (Signed)
Ongoing.  Noted on DEXA in 2020.  Continue on calcium and Vit D supplement daily.  Repeat DEXA in August 2025 (12/30/18 last).  Check Vit D level on labs.

## 2023-02-27 NOTE — Assessment & Plan Note (Signed)
Chronic, ongoing.  Continue current medication regimen and adjust as needed.  Thyroid labs today. 

## 2023-02-28 LAB — COMPREHENSIVE METABOLIC PANEL
ALT: 21 [IU]/L (ref 0–32)
AST: 24 [IU]/L (ref 0–40)
Albumin: 4.5 g/dL (ref 3.9–4.9)
Alkaline Phosphatase: 71 [IU]/L (ref 44–121)
BUN/Creatinine Ratio: 21 (ref 12–28)
BUN: 18 mg/dL (ref 8–27)
Bilirubin Total: 0.6 mg/dL (ref 0.0–1.2)
CO2: 25 mmol/L (ref 20–29)
Calcium: 9 mg/dL (ref 8.7–10.3)
Chloride: 103 mmol/L (ref 96–106)
Creatinine, Ser: 0.84 mg/dL (ref 0.57–1.00)
Globulin, Total: 1.7 g/dL (ref 1.5–4.5)
Glucose: 80 mg/dL (ref 70–99)
Potassium: 4.1 mmol/L (ref 3.5–5.2)
Sodium: 140 mmol/L (ref 134–144)
Total Protein: 6.2 g/dL (ref 6.0–8.5)
eGFR: 76 mL/min/{1.73_m2} (ref >59)

## 2023-02-28 LAB — CBC WITH DIFFERENTIAL/PLATELET
Basophils Absolute: 0 10*3/uL (ref 0.0–0.2)
Basos: 0 %
EOS (ABSOLUTE): 0.1 10*3/uL (ref 0.0–0.4)
Eos: 1 %
Hematocrit: 43.1 % (ref 34.0–46.6)
Hemoglobin: 14.4 g/dL (ref 11.1–15.9)
Immature Grans (Abs): 0 10*3/uL (ref 0.0–0.1)
Immature Granulocytes: 0 %
Lymphocytes Absolute: 1.5 10*3/uL (ref 0.7–3.1)
Lymphs: 29 %
MCH: 30.9 pg (ref 26.6–33.0)
MCHC: 33.4 g/dL (ref 31.5–35.7)
MCV: 93 fL (ref 79–97)
Monocytes Absolute: 0.4 10*3/uL (ref 0.1–0.9)
Monocytes: 7 %
Neutrophils Absolute: 3.2 10*3/uL (ref 1.4–7.0)
Neutrophils: 63 %
Platelets: 214 10*3/uL (ref 150–450)
RBC: 4.66 x10E6/uL (ref 3.77–5.28)
RDW: 13.1 % (ref 11.7–15.4)
WBC: 5.1 10*3/uL (ref 3.4–10.8)

## 2023-02-28 LAB — LIPID PANEL W/O CHOL/HDL RATIO
Cholesterol, Total: 219 mg/dL — ABNORMAL HIGH (ref 100–199)
HDL: 79 mg/dL (ref >39)
LDL Chol Calc (NIH): 131 mg/dL — ABNORMAL HIGH (ref 0–99)
Triglycerides: 49 mg/dL (ref 0–149)
VLDL Cholesterol Cal: 9 mg/dL (ref 5–40)

## 2023-02-28 LAB — VITAMIN D 25 HYDROXY (VIT D DEFICIENCY, FRACTURES): Vit D, 25-Hydroxy: 58 ng/mL (ref 30.0–100.0)

## 2023-02-28 LAB — TSH: TSH: 2.28 u[IU]/mL (ref 0.450–4.500)

## 2023-02-28 LAB — T4, FREE: Free T4: 1.45 ng/dL (ref 0.82–1.77)

## 2023-02-28 NOTE — Progress Notes (Signed)
Contacted via MyChart The 10-year ASCVD risk score (Arnett DK, et al., 2019) is: 3.8%   Values used to calculate the score:     Age: 67 years     Sex: Female     Is Non-Hispanic African American: No     Diabetic: No     Tobacco smoker: No     Systolic Blood Pressure: 98 mmHg     Is BP treated: No     HDL Cholesterol: 79 mg/dL     Total Cholesterol: 219 mg/dL   Good evening Ashley Lawrence, your labs have returned and overall remain stable wit exception of total cholesterol and LDL remaining elevated.  Continue focus on diet and regular activity + take a Omega 3 supplement.  No medication needed at this time.  Any questions? Keep being amazing!!  Thank you for allowing me to participate in your care.  I appreciate you. Kindest regards, Aarron Wierzbicki

## 2023-04-03 ENCOUNTER — Telehealth: Payer: Self-pay | Admitting: Nurse Practitioner

## 2023-04-03 NOTE — Telephone Encounter (Signed)
Copied from CRM [id number]. Topic: Complaint (DO NOT CONVERT) - Billing/Coding >> Apr 03, 2023 10:11 AM Franchot Heidelberg wrote: DOS: 02/27/2023  Details of complaint: Says she spoke with her insurance and the billing is "incorrect"   Says that 2 out of 5 labs were not billed under preventative care when they should have been she says.   102.90 -needs to be billed under the preventative code list  243.60 -needs to be billed under the preventative code list   How would the patient like to see this issue resolved? Pt would like a call back to discuss/resolve.  Route to Research officer, political party.

## 2023-04-22 ENCOUNTER — Ambulatory Visit: Payer: 59 | Admitting: Nurse Practitioner

## 2023-04-29 ENCOUNTER — Ambulatory Visit
Admission: RE | Admit: 2023-04-29 | Discharge: 2023-04-29 | Disposition: A | Discharge status: Home / Self Care | Payer: 59 | Source: Ambulatory Visit | Attending: Nurse Practitioner | Admitting: Nurse Practitioner

## 2023-04-29 DIAGNOSIS — Z1231 Encounter for screening mammogram for malignant neoplasm of breast: Secondary | ICD-10-CM | POA: Diagnosis present

## 2023-04-30 NOTE — Progress Notes (Signed)
Contacted via MyChart   Normal mammogram, may repeat in one year:)

## 2023-07-03 ENCOUNTER — Telehealth: Payer: Self-pay | Admitting: Nurse Practitioner

## 2023-07-03 NOTE — Telephone Encounter (Signed)
 error

## 2023-07-14 ENCOUNTER — Telehealth: Payer: Self-pay

## 2023-07-14 NOTE — Telephone Encounter (Signed)
 Copied from CRM [id number]. Topic: Complaint (DO NOT CONVERT) - Billing/Coding >> Apr 03, 2023 10:11 AM Franchot Heidelberg wrote: DOS: 02/27/2023  Details of complaint: Says she spoke with her insurance and the billing is "incorrect"   Says that 2 out of 5 labs were not billed under preventative care when they should have been she says.   102.90 -needs to be billed under the preventative code list  243.60 -needs to be billed under the preventative code list   How would the patient like to see this issue resolved? Pt would like a call back to discuss/resolve.  Route to Research officer, political party.

## 2023-07-17 NOTE — Telephone Encounter (Signed)
 Contacted patient regarding lab bills.  Patient stated issue has been resolved.

## 2023-07-18 ENCOUNTER — Encounter: Payer: Self-pay | Admitting: Nurse Practitioner

## 2023-07-18 ENCOUNTER — Ambulatory Visit: Payer: 59 | Admitting: Nurse Practitioner

## 2023-07-18 VITALS — BP 105/68 | HR 67 | Temp 98.4°F | Ht 63.0 in | Wt 136.3 lb

## 2023-07-18 DIAGNOSIS — M542 Cervicalgia: Secondary | ICD-10-CM
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
## 2023-09-29 ENCOUNTER — Ambulatory Visit: Payer: Self-pay

## 2023-09-29 NOTE — Telephone Encounter (Signed)
 Noted.

## 2023-09-29 NOTE — Telephone Encounter (Signed)
 Copied from CRM [id number]. Topic: Appointments - Appointment Scheduling >> Sep 29, 2023  7:40 AM Essie A wrote: Patient/patient representative is calling to schedule an appointment. Refer to attachments for appointment information.    Chief Complaint: Sinus Pressure/Weakness, Coughing Symptoms: chills, fever, sinus pressure, coughing, runny nose, sneezing Frequency: 3 days ago Pertinent Negatives: Patient denies chest pain, difficulty breathing, coughing up blood. Disposition: [] ED /[] Urgent Care (no appt availability in office) / [x] Appointment(In office/virtual)/ []  [facility]/ [] Home Care/ [] Refused Recommended Disposition /[] [facility]/ []  Follow-up with PCP Additional Notes: Patient called and advised that she started feeling bad 3 days ago. She states she is having sinus pressure, dry cough, weakness, fever, chills, runny nose , sneezing. Patient denies difficulty breathing, chest pain, coughing up blood. Video appointment (requested by patient to be a video visit) is made for tomorrow 09/30/2023 at 11:20 am with patient's PCP. Patient is given Care Advice as per protocol and patient is also advised that if anything gets worse to go to the Emergency Room. Patient verbalized understanding.   Reason for Disposition  [1] Using nasal washes and pain medicine > 24 hours AND [2] sinus pain (around cheekbone or eye) persists  Mild weakness or fatigue with acute minor illness (e.g., colds)  Answer Assessment - Initial Assessment Questions 1. ONSET: "When did the cough begin?"      3 days ago 2. SEVERITY: "How bad is the cough today?"      --- 3. SPUTUM: "Describe the color of your sputum" (none, dry cough; clear, white, yellow, green)     --- 4. HEMOPTYSIS: "Are you coughing up any blood?" If so ask: "How much?" (flecks, streaks, tablespoons, etc.)     No 5. DIFFICULTY BREATHING: "Are you having difficulty breathing?" If Yes, ask: "How bad is it?" (e.g., mild,  moderate, severe)    - MILD: No SOB at rest, mild SOB with walking, speaks normally in sentences, can lie down, no retractions, pulse < 100.    - MODERATE: SOB at rest, SOB with minimal exertion and prefers to sit, cannot lie down flat, speaks in phrases, mild retractions, audible wheezing, pulse 100-120.    - SEVERE: Very SOB at rest, speaks in single words, struggling to breathe, sitting hunched forward, retractions, pulse > 120      Denies 6. FEVER: "Do you have a fever?" If Yes, ask: "What is your temperature, how was it measured, and when did it start?"     Hasn't checked 7. CARDIAC HISTORY: "Do you have any history of heart disease?" (e.g., heart attack, congestive heart failure)      No 8. LUNG HISTORY: "Do you have any history of lung disease?"  (e.g., pulmonary embolus, asthma, emphysema)     No 9. PE RISK FACTORS: "Do you have a history of blood clots?" (or: recent major surgery, recent prolonged travel, bedridden)     No 10. OTHER SYMPTOMS: "Do you have any other symptoms?" (e.g., runny nose, wheezing, chest pain)       Runny nose, sneezing, sinus pressure 12. TRAVEL: "Have you traveled out of the country in the last month?" (e.g., travel history, exposures)       No  Answer Assessment - Initial Assessment Questions 1. DESCRIPTION: "Describe how you are feeling."     Generalized malaise 2. SEVERITY: "How bad is it?"  "Can you stand and walk?"   - MILD (0-3): Feels weak or tired, but does not interfere with work, school or normal activities.   -  MODERATE (4-7): Able to stand and walk; weakness interferes with work, school, or normal activities.   - SEVERE (8-10): Unable to stand or walk; unable to do usual activities.     mild 3. ONSET: "When did these symptoms begin?" (e.g., hours, days, weeks, months)     3 days ago 4. CAUSE: "What do you think is causing the weakness or fatigue?" (e.g., not drinking enough fluids, medical problem, trouble sleeping)     Sinus  pressure/coughing, runny nose 5. NEW MEDICINES:  "Have you started on any new medicines recently?" (e.g., opioid pain medicines, benzodiazepines, muscle relaxants, antidepressants, antihistamines, neuroleptics, beta blockers)     No 6. OTHER SYMPTOMS: "Do you have any other symptoms?" (e.g., chest pain, fever, cough, SOB, vomiting, diarrhea, bleeding, other areas of pain)     Dry cough, sinus pressure, runny nose, fever  Protocols used: Weakness (Generalized) and Fatigue-A-AH, Cough - Acute Non-Productive-A-AH

## 2023-09-29 NOTE — Telephone Encounter (Signed)
 Scheduled for tomorrow.

## 2023-09-30 ENCOUNTER — Encounter: Payer: Self-pay | Admitting: Nurse Practitioner

## 2023-09-30 ENCOUNTER — Telehealth: Admitting: Nurse Practitioner

## 2023-09-30 DIAGNOSIS — R051 Acute cough: Secondary | ICD-10-CM

## 2023-09-30 NOTE — Patient Instructions (Signed)

## 2023-09-30 NOTE — Progress Notes (Signed)
 There were no vitals taken for this visit.   Subjective:    Patient ID: Ashley Lawrence, female    DOB: 02/09/1956, 68 y.o.   MRN: [id number]  HPI: Ashley Lawrence is a 68 y.o. female  Chief Complaint  Patient presents with   URI    Patient states she has been having sinus pressure, fatigue, fever, productive cough, runny nose, and sneezing since Friday. States she has been taking Tylenol and Coricidin.    Virtual Visit via Video Note  I connected with Lanique Lacount on 09/30/23 at 11:20 AM EDT by a video enabled telemedicine application and verified that I am speaking with the correct person using two identifiers.  Location: Patient: home Provider: work   I discussed the limitations of evaluation and management by telemedicine and the availability of in person appointments. The patient expressed understanding and agreed to proceed.  I discussed the assessment and treatment plan with the patient. The patient was provided an opportunity to ask questions and all were answered. The patient agreed with the plan and demonstrated an understanding of the instructions.   The patient was advised to call back or seek an in-person evaluation if the symptoms worsen or if the condition fails to improve as anticipated.  I provided 25 minutes of non-face-to-face time during this encounter.   Torie Towle T Ayman Brull, NP   UPPER RESPIRATORY TRACT INFECTION Started having symptoms Friday afternoon, on day 5.  Was really weak at first and then nose started draining.  Now is coughing up stuff.  No testing at home.  Fever: yes with chills -- has not checked temp but husband reports she is warm Cough: yes Shortness of breath: no Wheezing: no Chest pain: no Chest tightness: yes Chest congestion: yes Nasal congestion: yes Runny nose: yes Post nasal drip: yes Sneezing: no Sore throat:  mild Swollen glands: no Sinus pressure: yes Headache: yes Face pain: no Toothache:  yesterday from pressure in head Ear  pain: none Ear pressure: yes bilateral Eyes red/itching: yes Eye drainage/crusting: no  Vomiting: no Rash: no Fatigue: yes Sick contacts: yes at workplace Strep contacts: no  Context: fluctuating Recurrent sinusitis: no Relief with OTC cold/cough medications: yes a little bit  Treatments attempted: Claritin D, Tylenol    Relevant past medical, surgical, family and social history reviewed and updated as indicated. Interim medical history since our last visit reviewed. Allergies and medications reviewed and updated.  Review of Systems  Constitutional:  Positive for fatigue and fever. Negative for activity change and appetite change.  HENT:  Positive for congestion, postnasal drip, rhinorrhea, sinus pressure, sinus pain and sore throat. Negative for ear discharge, ear pain, facial swelling, sneezing and voice change.   Respiratory:  Positive for cough and chest tightness. Negative for shortness of breath and wheezing.   Cardiovascular:  Negative for chest pain, palpitations and leg swelling.  Gastrointestinal: Negative.   Endocrine: Negative.   Neurological:  Positive for headaches. Negative for dizziness and numbness.  Psychiatric/Behavioral: Negative.      Per HPI unless specifically indicated above     Objective:    There were no vitals taken for this visit.  Wt Readings from Last 3 Encounters:  07/18/23 136 lb 4.8 oz (61.8 kg)  02/27/23 133 lb 9.6 oz (60.6 kg)  03/28/22 131 lb 4.8 oz (59.6 kg)    Physical Exam Vitals and nursing note reviewed.  Constitutional:      General: She is awake. She is not in acute distress.  Appearance: She is well-developed and well-groomed. She is ill-appearing. She is not toxic-appearing.  HENT:     Head: Normocephalic.     Right Ear: Hearing normal.     Left Ear: Hearing normal.  Eyes:     General: Lids are normal.        Right eye: No discharge.        Left eye: No discharge.     Conjunctiva/sclera: Conjunctivae normal.   Pulmonary:     Effort: Pulmonary effort is normal. No accessory muscle usage or respiratory distress.  Musculoskeletal:     Cervical back: Normal range of motion.  Neurological:     Mental Status: She is alert and oriented to person, place, and time.  Psychiatric:        Attention and Perception: Attention normal.        Mood and Affect: Mood normal.        Behavior: Behavior normal. Behavior is cooperative.        Thought Content: Thought content normal.        Judgment: Judgment normal.    Results for orders placed or performed in visit on 02/27/23  T4, free   Collection Time: 02/27/23  9:54 AM  Result Value Ref Range   Free T4 1.45 0.82 - 1.77 ng/dL  Comprehensive metabolic panel   Collection Time: 02/27/23  9:54 AM  Result Value Ref Range   Glucose 80 70 - 99 mg/dL   BUN 18 8 - 27 mg/dL   Creatinine, Ser 7.84 0.57 - 1.00 mg/dL   eGFR 76 >69 GE/XBM/8.41   BUN/Creatinine Ratio 21 12 - 28   Sodium 140 134 - 144 mmol/L   Potassium 4.1 3.5 - 5.2 mmol/L   Chloride 103 96 - 106 mmol/L   CO2 25 20 - 29 mmol/L   Calcium 9.0 8.7 - 10.3 mg/dL   Total Protein 6.2 6.0 - 8.5 g/dL   Albumin 4.5 3.9 - 4.9 g/dL   Globulin, Total 1.7 1.5 - 4.5 g/dL   Bilirubin Total 0.6 0.0 - 1.2 mg/dL   Alkaline Phosphatase 71 44 - 121 IU/L   AST 24 0 - 40 IU/L   ALT 21 0 - 32 IU/L  CBC with Differential/Platelet   Collection Time: 02/27/23  9:54 AM  Result Value Ref Range   WBC 5.1 3.4 - 10.8 x10E3/uL   RBC 4.66 3.77 - 5.28 x10E6/uL   Hemoglobin 14.4 11.1 - 15.9 g/dL   Hematocrit 32.4 40.1 - 46.6 %   MCV 93 79 - 97 fL   MCH 30.9 26.6 - 33.0 pg   MCHC 33.4 31.5 - 35.7 g/dL   RDW 02.7 25.3 - 66.4 %   Platelets 214 150 - 450 x10E3/uL   Neutrophils 63 Not Estab. %   Lymphs 29 Not Estab. %   Monocytes 7 Not Estab. %   Eos 1 Not Estab. %   Basos 0 Not Estab. %   Neutrophils Absolute 3.2 1.4 - 7.0 x10E3/uL   Lymphocytes Absolute 1.5 0.7 - 3.1 x10E3/uL   Monocytes Absolute 0.4 0.1 - 0.9  x10E3/uL   EOS (ABSOLUTE) 0.1 0.0 - 0.4 x10E3/uL   Basophils Absolute 0.0 0.0 - 0.2 x10E3/uL   Immature Granulocytes 0 Not Estab. %   Immature Grans (Abs) 0.0 0.0 - 0.1 x10E3/uL  TSH   Collection Time: 02/27/23  9:54 AM  Result Value Ref Range   TSH 2.280 0.450 - 4.500 uIU/mL  Lipid Panel w/o Chol/HDL Ratio   Collection  Time: 02/27/23  9:54 AM  Result Value Ref Range   Cholesterol, Total 219 (H) 100 - 199 mg/dL   Triglycerides 49 0 - 149 mg/dL   HDL 79 >16 mg/dL   VLDL Cholesterol Cal 9 5 - 40 mg/dL   LDL Chol Calc (NIH) 109 (H) 0 - 99 mg/dL  VITAMIN D  25 Hydroxy (Vit-D Deficiency, Fractures)   Collection Time: 02/27/23  9:54 AM  Result Value Ref Range   Vit D, 25-Hydroxy 58.0 30.0 - 100.0 ng/mL      Assessment & Plan:   Problem List Items Addressed This Visit       Other   Acute cough - Primary   Acute for 5 days, past dates for Covid or flu treatment.  Symptoms are more consistent with a viral upper respiratory infection. Discussed with her these often run their course in 5-7 days and antibiotics don't work against viruses, but increase risk for side effects. Offered a 5 day course of Prednisone  to help with inflammation, she declined at this time.  Discussed with her if not feeling better by Thursday or Friday to alert provider and will send in abx therapy at day 7 or 8.   - Increased rest - Increasing Fluids - Acetaminophen / ibuprofen as needed for fever/pain.  - Salt water gargling, chloraseptic spray and throat lozenges - Mucinex.  - Saline sinus flushes or a neti pot.  - Humidifying the air.         Follow up plan: Return if symptoms worsen or fail to improve.

## 2023-09-30 NOTE — Assessment & Plan Note (Signed)
 Acute for 5 days, past dates for Covid or flu treatment.  Symptoms are more consistent with a viral upper respiratory infection. Discussed with her these often run their course in 5-7 days and antibiotics don't work against viruses, but increase risk for side effects. Offered a 5 day course of Prednisone  to help with inflammation, she declined at this time.  Discussed with her if not feeling better by Thursday or Friday to alert provider and will send in abx therapy at day 7 or 8.   - Increased rest - Increasing Fluids - Acetaminophen / ibuprofen as needed for fever/pain.  - Salt water gargling, chloraseptic spray and throat lozenges - Mucinex.  - Saline sinus flushes or a neti pot.  - Humidifying the air.

## 2023-10-05 ENCOUNTER — Encounter: Payer: Self-pay | Admitting: Nurse Practitioner

## 2023-10-06 ENCOUNTER — Other Ambulatory Visit: Payer: Self-pay | Admitting: Nurse Practitioner

## 2023-10-06 DIAGNOSIS — Z1211 Encounter for screening for malignant neoplasm of colon: Secondary | ICD-10-CM

## 2023-10-08 ENCOUNTER — Ambulatory Visit: Admitting: Nurse Practitioner

## 2023-10-08 ENCOUNTER — Ambulatory Visit (INDEPENDENT_AMBULATORY_CARE_PROVIDER_SITE_OTHER): Admitting: Nurse Practitioner

## 2023-10-08 ENCOUNTER — Encounter: Payer: Self-pay | Admitting: Nurse Practitioner

## 2023-10-08 VITALS — BP 110/68 | HR 76 | Temp 98.3°F | Ht 63.0 in | Wt 133.0 lb

## 2023-10-08 DIAGNOSIS — R051 Acute cough: Secondary | ICD-10-CM | POA: Diagnosis not present

## 2023-10-08 DIAGNOSIS — K644 Residual hemorrhoidal skin tags: Secondary | ICD-10-CM | POA: Insufficient documentation

## 2023-10-08 NOTE — Patient Instructions (Signed)
Rectal Bleeding    Rectal bleeding is when blood comes out of the opening of the butt (anus). You may see bright red blood in your underwear or in the toilet after you poop (have a bowel movement). You may also have blood mixed with your poop (stool), or dark red or black poop.  Rectal bleeding is often a sign that something is wrong. It can be caused by many things. It needs to be checked by a doctor.  Follow these instructions at home:  Medicines  Take over-the-counter and prescription medicines only as told by your doctor.  Ask your doctor about changing or stopping your normal medicines. These include blood thinners.  Managing constipation    Your condition may cause trouble pooping (constipation). To prevent or treat this, or to help make your poop soft, you may need to:  Drink enough fluid to keep your pee (urine) pale yellow.  Take over-the-counter or prescription medicines.  Eat foods that are high in fiber. These include beans, whole grains, and fresh fruits and vegetables.  Limit foods that are high in fat and sugar. These include fried or sweet foods.     General instructions  Try not to strain when you poop.  Take a warm bath. This may help with pain.  Watch for changes in your symptoms.  Contact a doctor if:  You have pain or swelling in your belly (abdomen).  You have a fever.  You feel weak or like you may vomit.  You cannot poop.  You have new or more bleeding.  You have black or dark red poop.  You vomit blood or something that looks like coffee grounds.  Get help right away if:  You faint.  You have very bad pain in your butt.  These symptoms may be an emergency. Get help right away. Call 911.  Do not wait to see if the symptoms will go away.  Do not drive yourself to the hospital.  This information is not intended to replace advice given to you by your health care provider. Make sure you discuss any questions you have with your health care provider.  Document Revised: 01/01/2022 Document Reviewed:  01/01/2022  Elsevier Patient Education  2024 [employer].

## 2023-10-08 NOTE — Assessment & Plan Note (Signed)
 Acute and improved.  No further symptoms.

## 2023-10-08 NOTE — Progress Notes (Signed)
 BP 110/68   Pulse 76   Temp 98.3 F (36.8 C) (Oral)   Ht 5\' 3"  (1.6 m)   Wt 133 lb (60.3 kg)   SpO2 97%   BMI 23.56 kg/m    Subjective:    Patient ID: Ashley Lawrence, female    DOB: April 24, 1956, 68 y.o.   MRN: [id number]  HPI: Ashley Lawrence is a 68 y.o. female  Chief Complaint  Patient presents with   Cough    1 week f/up   Rectal Bleeding    Patient states she has been noticing some blood from her rectum, thinks she may have a hemorrhoid. Cologuard ordered last week. States the blood has been bright red in color.    UPPER RESPIRATORY TRACT INFECTION Follow-up today on sinus infection.  Was seen 09/30/23 for this and recommended to take OTC medications. Fever: no Cough: no Shortness of breath: no Wheezing: no Chest pain: no Chest tightness: no Chest congestion: no Nasal congestion: no Runny nose: no Post nasal drip: no Sneezing: no Sore throat: no Swollen glands: no Sinus pressure: no Headache: no Face pain: no Toothache: no Ear pain: none Ear pressure: none Eyes red/itching:no Eye drainage/crusting: no  Context: better Recurrent sinusitis: no Relief with OTC cold/cough medications: yes  Treatments attempted: cold/sinus and mucinex    RECTAL BLEEDING Started about 7 days ago, was still sick and stomach was upset.  Did not bleed immediately, but then had some. Yesterday when had BM there was blood, this morning was only a little bit of blood and improving. Duration: days Bright red rectal bleeding: yes  Amount of blood: minimal  Frequency: every BM -- today was better Melena: no  Spotting on toilet tissue: yes  Anal fullness: no  Perianal pain: no  Severity: none Perianal irritation/itching: no  Constipation: sometimes will strain  Chronic straining/valsava:  occasional  Anal trauma/intercourse: no  Hemorrhoids: yes  Previous colonoscopy: yes -- does Cologuard now, last colonoscopy 20 years ago  Relevant past medical, surgical, family and social history  reviewed and updated as indicated. Interim medical history since our last visit reviewed. Allergies and medications reviewed and updated.  Review of Systems  Constitutional:  Negative for activity change, appetite change, diaphoresis, fatigue and fever.  HENT: Negative.    Respiratory:  Negative for cough, chest tightness, shortness of breath and wheezing.   Cardiovascular:  Negative for chest pain, palpitations and leg swelling.  Gastrointestinal:  Positive for anal bleeding. Negative for abdominal distention, abdominal pain, constipation, diarrhea, nausea and vomiting.  Neurological: Negative.   Psychiatric/Behavioral: Negative.      Per HPI unless specifically indicated above     Objective:     BP 110/68   Pulse 76   Temp 98.3 F (36.8 C) (Oral)   Ht 5\' 3"  (1.6 m)   Wt 133 lb (60.3 kg)   SpO2 97%   BMI 23.56 kg/m   Wt Readings from Last 3 Encounters:  10/08/23 133 lb (60.3 kg)  07/18/23 136 lb 4.8 oz (61.8 kg)  02/27/23 133 lb 9.6 oz (60.6 kg)    Physical Exam Vitals and nursing note reviewed. Exam conducted with a chaperone present.  Constitutional:      General: She is awake. She is not in acute distress.    Appearance: She is well-developed and well-groomed. She is not ill-appearing or toxic-appearing.  HENT:     Head: Normocephalic.     Right Ear: Hearing, tympanic membrane, ear canal and external ear normal.  Left Ear: Hearing, tympanic membrane, ear canal and external ear normal.     Nose: Nose normal.     Right Sinus: No maxillary sinus tenderness or frontal sinus tenderness.     Left Sinus: No maxillary sinus tenderness or frontal sinus tenderness.     Mouth/Throat:     Mouth: Mucous membranes are moist.     Pharynx: Oropharynx is clear.  Eyes:     General: Lids are normal.        Right eye: No discharge.        Left eye: No discharge.     Conjunctiva/sclera: Conjunctivae normal.     Pupils: Pupils are equal, round, and reactive to light.  Neck:      Thyroid : No thyromegaly.     Vascular: No carotid bruit.  Cardiovascular:     Rate and Rhythm: Normal rate and regular rhythm.     Heart sounds: Normal heart sounds. No murmur heard.    No gallop.  Pulmonary:     Effort: Pulmonary effort is normal. No accessory muscle usage or respiratory distress.     Breath sounds: Normal breath sounds.  Abdominal:     General: Bowel sounds are normal. There is no distension.     Palpations: Abdomen is soft.     Tenderness: There is no abdominal tenderness.  Genitourinary:    Rectum: External hemorrhoid present. No tenderness.     Comments: One moderate size external hemorrhoid, non inflamed, some old dry blood to area. Musculoskeletal:     Cervical back: Normal range of motion and neck supple.     Right lower leg: No edema.     Left lower leg: No edema.  Lymphadenopathy:     Cervical: No cervical adenopathy.  Skin:    General: Skin is warm and dry.  Neurological:     Mental Status: She is alert and oriented to person, place, and time.     Deep Tendon Reflexes: Reflexes are normal and symmetric.     Reflex Scores:      Brachioradialis reflexes are 2+ on the right side and 2+ on the left side.      Patellar reflexes are 2+ on the right side and 2+ on the left side. Psychiatric:        Attention and Perception: Attention normal.        Mood and Affect: Mood normal.        Speech: Speech normal.        Behavior: Behavior normal. Behavior is cooperative.        Thought Content: Thought content normal.     Results for orders placed or performed in visit on 02/27/23  T4, free   Collection Time: 02/27/23  9:54 AM  Result Value Ref Range   Free T4 1.45 0.82 - 1.77 ng/dL  Comprehensive metabolic panel   Collection Time: 02/27/23  9:54 AM  Result Value Ref Range   Glucose 80 70 - 99 mg/dL   BUN 18 8 - 27 mg/dL   Creatinine, Ser 1.61 0.57 - 1.00 mg/dL   eGFR 76 >09 UE/AVW/0.98   BUN/Creatinine Ratio 21 12 - 28   Sodium 140 134 - 144 mmol/L    Potassium 4.1 3.5 - 5.2 mmol/L   Chloride 103 96 - 106 mmol/L   CO2 25 20 - 29 mmol/L   Calcium 9.0 8.7 - 10.3 mg/dL   Total Protein 6.2 6.0 - 8.5 g/dL   Albumin 4.5 3.9 - 4.9 g/dL  Globulin, Total 1.7 1.5 - 4.5 g/dL   Bilirubin Total 0.6 0.0 - 1.2 mg/dL   Alkaline Phosphatase 71 44 - 121 IU/L   AST 24 0 - 40 IU/L   ALT 21 0 - 32 IU/L  CBC with Differential/Platelet   Collection Time: 02/27/23  9:54 AM  Result Value Ref Range   WBC 5.1 3.4 - 10.8 x10E3/uL   RBC 4.66 3.77 - 5.28 x10E6/uL   Hemoglobin 14.4 11.1 - 15.9 g/dL   Hematocrit 65.7 84.6 - 46.6 %   MCV 93 79 - 97 fL   MCH 30.9 26.6 - 33.0 pg   MCHC 33.4 31.5 - 35.7 g/dL   RDW 96.2 95.2 - 84.1 %   Platelets 214 150 - 450 x10E3/uL   Neutrophils 63 Not Estab. %   Lymphs 29 Not Estab. %   Monocytes 7 Not Estab. %   Eos 1 Not Estab. %   Basos 0 Not Estab. %   Neutrophils Absolute 3.2 1.4 - 7.0 x10E3/uL   Lymphocytes Absolute 1.5 0.7 - 3.1 x10E3/uL   Monocytes Absolute 0.4 0.1 - 0.9 x10E3/uL   EOS (ABSOLUTE) 0.1 0.0 - 0.4 x10E3/uL   Basophils Absolute 0.0 0.0 - 0.2 x10E3/uL   Immature Granulocytes 0 Not Estab. %   Immature Grans (Abs) 0.0 0.0 - 0.1 x10E3/uL  TSH   Collection Time: 02/27/23  9:54 AM  Result Value Ref Range   TSH 2.280 0.450 - 4.500 uIU/mL  Lipid Panel w/o Chol/HDL Ratio   Collection Time: 02/27/23  9:54 AM  Result Value Ref Range   Cholesterol, Total 219 (H) 100 - 199 mg/dL   Triglycerides 49 0 - 149 mg/dL   HDL 79 >32 mg/dL   VLDL Cholesterol Cal 9 5 - 40 mg/dL   LDL Chol Calc (NIH) 440 (H) 0 - 99 mg/dL  VITAMIN D  25 Hydroxy (Vit-D Deficiency, Fractures)   Collection Time: 02/27/23  9:54 AM  Result Value Ref Range   Vit D, 25-Hydroxy 58.0 30.0 - 100.0 ng/mL      Assessment & Plan:   Problem List Items Addressed This Visit       Cardiovascular and Mediastinum   External hemorrhoid   With recent bleeding which has improved some today.  Recommend heavy focus on diet and ensuring good fiber  intake daily.  Goal is daily or every other day BM with no straining.  If straining then recommend taking OTC fiber supplement.  Ensure plenty of water intake daily.        Other   Acute cough - Primary   Acute and improved.  No further symptoms.        Follow up plan: Return if symptoms worsen or fail to improve.

## 2023-10-08 NOTE — Assessment & Plan Note (Signed)
 With recent bleeding which has improved some today.  Recommend heavy focus on diet and ensuring good fiber intake daily.  Goal is daily or every other day BM with no straining.  If straining then recommend taking OTC fiber supplement.  Ensure plenty of water intake daily.

## 2023-10-15 ENCOUNTER — Other Ambulatory Visit: Payer: Self-pay

## 2023-10-15 ENCOUNTER — Emergency Department
Admission: EM | Admit: 2023-10-15 | Discharge: 2023-10-15 | Discharge status: Against Medical Advice | Attending: Emergency Medicine | Admitting: Emergency Medicine

## 2023-10-15 DIAGNOSIS — T1502XA Foreign body in cornea, left eye, initial encounter: Secondary | ICD-10-CM | POA: Insufficient documentation

## 2023-10-15 DIAGNOSIS — Z5321 Procedure and treatment not carried out due to patient leaving prior to being seen by health care provider: Secondary | ICD-10-CM | POA: Insufficient documentation

## 2023-10-15 DIAGNOSIS — X58XXXA Exposure to other specified factors, initial encounter: Secondary | ICD-10-CM | POA: Insufficient documentation

## 2023-10-15 MED ORDER — FLUORESCEIN SODIUM 1 MG OP STRP: 1.0000 | ORAL_STRIP | Freq: Once | OPHTHALMIC | Status: DC

## 2023-10-15 MED ORDER — TETRACAINE HCL 0.5 % OP SOLN: 2.0000 [drp] | Freq: Once | OPHTHALMIC | Status: DC

## 2023-10-15 NOTE — ED Triage Notes (Signed)
 Pt to ED via POV c/o foreign body in left eye. Pt reports she waas getting out the shower about an hour ago and started feeling like something was in her eye. Pt denies any blurred vision. Redness and pain to left eye.

## 2023-10-30 LAB — COLOGUARD: COLOGUARD: NEGATIVE

## 2023-10-31 ENCOUNTER — Ambulatory Visit: Payer: Self-pay | Admitting: Nurse Practitioner

## 2023-10-31 NOTE — Progress Notes (Signed)
Contacted via MyChart   Cologuard is negative!!!!:) Repeat in 3 years.

## 2024-02-08 ENCOUNTER — Encounter: Payer: Self-pay | Admitting: Nurse Practitioner

## 2024-02-09 MED ORDER — LEVOTHYROXINE SODIUM 50 MCG PO TABS: 50.0000 ug | ORAL_TABLET | Freq: Every day | ORAL | 4 refills | Status: AC

## 2024-02-23 ENCOUNTER — Ambulatory Visit: Payer: Self-pay

## 2024-02-23 NOTE — Telephone Encounter (Signed)
 Noted

## 2024-02-23 NOTE — Telephone Encounter (Signed)
 FYI Only or Action Required?: FYI only for provider.  Patient was last seen in primary care on 10/08/2023 by Valerio Melanie DASEN, NP.  Called Nurse Triage reporting No chief complaint on file..  Symptoms began several weeks ago.  Interventions attempted: Nothing.  Symptoms are: unchanged.  Triage Disposition: See PCP Within 2 Weeks  Patient/caregiver understands and will follow disposition?: Yes  Copied from CRM [id number]. Topic: Clinical - Red Word Triage >> Feb 23, 2024  8:51 AM Roselie BROCKS wrote: [facility] that prompted transfer to Nurse Triage: Patient states her mouth is very sensitive and having severe pain in her whole mouth Reason for Disposition  All other mouth symptoms  (Exceptions: Dry mouth from not drinking enough liquids, chapped lips.)  Answer Assessment - Initial Assessment Questions 1. SYMPTOM: What's the main symptom you're concerned about? (e.g., chapped lips, dry mouth, lump, sores)     Half of the Tongue, Roof of the Mouth, Burning Sensation  2. ONSET: When did the  symptoms  start?     2 Weeks  3. PAIN: Is there any pain? If Yes, ask: How bad is it? (Scale: 0-10; or none, mild, moderate, severe)     3-4, sensitivity more so than pain  4. CAUSE: What do you think is causing the symptoms?     Unsure  5. OTHER SYMPTOMS: Do you have any other symptoms? (e.g., fever, sore throat, toothache, swelling)     Denies any other symptoms  6. PREGNANCY: Is there any chance you are pregnant? When was your last menstrual period?     No and No  Protocols used: Mouth Symptoms-A-AH

## 2024-02-27 ENCOUNTER — Encounter: Payer: Self-pay | Admitting: Pediatrics

## 2024-02-27 ENCOUNTER — Ambulatory Visit (INDEPENDENT_AMBULATORY_CARE_PROVIDER_SITE_OTHER): Admitting: Pediatrics

## 2024-02-27 VITALS — BP 104/67 | HR 64 | Temp 97.6°F | Wt 136.0 lb

## 2024-02-27 DIAGNOSIS — R200 Anesthesia of skin: Secondary | ICD-10-CM

## 2024-02-27 DIAGNOSIS — E7800 Pure hypercholesterolemia, unspecified: Secondary | ICD-10-CM

## 2024-02-27 DIAGNOSIS — E039 Hypothyroidism, unspecified: Secondary | ICD-10-CM | POA: Diagnosis not present

## 2024-02-27 DIAGNOSIS — K146 Glossodynia: Secondary | ICD-10-CM

## 2024-02-27 DIAGNOSIS — R7309 Other abnormal glucose: Secondary | ICD-10-CM

## 2024-02-27 MED ORDER — CHLORHEXIDINE GLUCONATE 0.12 % MT SOLN
15.0000 mL | Freq: Two times a day (BID) | OROMUCOSAL | 0 refills | Status: DC
Start: 2024-02-27 — End: 2024-03-14

## 2024-02-27 MED ORDER — CHLORHEXIDINE GLUCONATE 0.12 % MT SOLN: 15.0000 mL | Freq: Two times a day (BID) | OROMUCOSAL | 0 refills | Status: DC

## 2024-02-27 NOTE — Patient Instructions (Signed)
 Use wash 2-4x per day for 10-16 days. Swish and spit.

## 2024-02-27 NOTE — Progress Notes (Signed)
 Office Visit  BP 104/67   Pulse 64   Temp 97.6 F (36.4 C) (Oral)   Wt 136 lb (61.7 kg)   SpO2 97%   BMI 24.09 kg/m    Subjective:    Patient ID: Ashley Lawrence, female    DOB: Mar 08, 1956, 68 y.o.   MRN: [id number]  HPI: Ashley Lawrence is a 68 y.o. female  Chief Complaint  Patient presents with   mouth discomfort    Feeling sensation as if pt burnt her tongue ongoing for a month     Discussed the use of AI scribe software for clinical note transcription with the patient, who gave verbal consent to proceed.  History of Present Illness   Ashley Lawrence is a 68 year old female who presents with a burning sensation in her mouth.  She has been experiencing a persistent burning sensation in her mouth for about a month, which she describes as similar to the feeling of burning one's mouth. The sensation is not very painful.  She has attempted to alleviate the symptoms by stopping her vitamins and levothyroxine , and changing her mouthwash and toothpaste, but these changes did not help. She noticed a small bump in her mouth yesterday.  She has resumed her thyroid  medication, levothyroxine , after a brief discontinuation. Her thyroid  levels have not been checked in a year, and she is fasting today for blood work, including her yearly labs and thyroid  check.  She has a dentist appointment scheduled for October 15th, but her dentist's office has been closed unexpectedly for the past two weeks. No recent cold sores or canker sores, though she has had canker sores in the past, which resolved quickly and were easily identifiable.  No fatigue or sleep issues. She feels generally well and is cautious about taking prescription medications, preferring to avoid them when possible.        Relevant past medical, surgical, family and social history reviewed and updated as indicated. Interim medical history since our last visit reviewed. Allergies and medications reviewed and updated.  ROS per HPI unless  specifically indicated above     Objective:    BP 104/67   Pulse 64   Temp 97.6 F (36.4 C) (Oral)   Wt 136 lb (61.7 kg)   SpO2 97%   BMI 24.09 kg/m   Wt Readings from Last 3 Encounters:  02/27/24 136 lb (61.7 kg)  10/08/23 133 lb (60.3 kg)  07/18/23 136 lb 4.8 oz (61.8 kg)     Physical Exam Constitutional:      Appearance: Normal appearance.  HENT:     Mouth/Throat:     Lips: No lesions.     Mouth: Mucous membranes are moist. No injury, lacerations or oral lesions.     Dentition: No gum lesions.     Tongue: No lesions.     Palate: No mass and lesions.     Pharynx: Oropharynx is clear. No oropharyngeal exudate or posterior oropharyngeal erythema.     Tonsils: No tonsillar exudate or tonsillar abscesses.  Pulmonary:     Effort: Pulmonary effort is normal.  Musculoskeletal:        General: Normal range of motion.  Skin:    Comments: Normal skin color  Neurological:     General: No focal deficit present.     Mental Status: She is alert. Mental status is at baseline.  Psychiatric:        Mood and Affect: Mood normal.        Behavior: Behavior  normal.        Thought Content: Thought content normal.         02/27/2023    9:42 AM 01/04/2022    8:08 AM 08/16/2020    9:00 AM 08/11/2019    8:17 AM  Depression screen PHQ 2/9  Decreased Interest 0 0 0 0  Down, Depressed, Hopeless 0 0 0 0  PHQ - 2 Score 0 0 0 0  Altered sleeping 0 0 0   Tired, decreased energy 0 0 0   Change in appetite 0 0 0   Feeling bad or failure about yourself  0 0 0   Trouble concentrating 0 0 0   Moving slowly or fidgety/restless 0 0 0   Suicidal thoughts 0 0 0   PHQ-9 Score 0 0 0   Difficult doing work/chores Not difficult at all Not difficult at all         02/27/2023    9:42 AM 01/04/2022    8:09 AM  GAD 7 : Generalized Anxiety Score  Nervous, Anxious, on Edge 0 0  Control/stop worrying 0 0  Worry too much - different things 0 0  Trouble relaxing 0 0  Restless 0 0  Easily annoyed  or irritable 0 0  Afraid - awful might happen 0 0  Total GAD 7 Score 0 0  Anxiety Difficulty Not difficult at all Not difficult at all       Assessment & Plan:  Assessment & Plan   Tongue burning sensation Numbness Persistent burning/numb sensation in mouth for one month. No lesions or infection. Exam unremarkable, unable to appreciate any worrisome lesions. Recommend eval by dentist. Given sensory changes, will check electrolytes, and CBC. Insurance will not cover screening A1c at this time, but consider if ongoing issues in a month. - Prescribed chlorhexidine mouthwash for oropharyngeal cleaning  - Ordered blood work including basic panel and A1c. - Advised to keep dental appointment for further evaluation. - Instructed to report if symptoms worsen or new symptoms arise. -     CBC with Differential/Platelet -     Comprehensive metabolic panel with GFR -     Chlorhexidine Gluconate; Use as directed 15 mLs in the mouth or throat 2 (two) times daily for 16 days. Swish and spit  Dispense: 473 mL; Refill: 0  Hypothyroidism, unspecified type Assessment & Plan: On levothyroxine . Resumed medication after temporary cessation. Thyroid  levels not recently checked. - Ordered thyroid  function tests as part of blood work.  Orders: -     TSH  Elevated LDL cholesterol level Assessment & Plan: Plan to complete today.   Orders: -     Lipid panel   Follow up plan: Return in about 4 weeks (around 03/26/2024).  Ashley SHAUNNA Nett, MD

## 2024-02-27 NOTE — Assessment & Plan Note (Signed)
 On levothyroxine . Resumed medication after temporary cessation. Thyroid  levels not recently checked. - Ordered thyroid  function tests as part of blood work.

## 2024-02-27 NOTE — Assessment & Plan Note (Signed)
 Plan to complete today.

## 2024-02-28 LAB — COMPREHENSIVE METABOLIC PANEL WITH GFR
ALT: 17 IU/L (ref 0–32)
AST: 24 IU/L (ref 0–40)
Albumin: 4.4 g/dL (ref 3.9–4.9)
Alkaline Phosphatase: 76 IU/L (ref 49–135)
BUN/Creatinine Ratio: 17 (ref 12–28)
BUN: 15 mg/dL (ref 8–27)
Bilirubin Total: 0.6 mg/dL (ref 0.0–1.2)
CO2: 23 mmol/L (ref 20–29)
Calcium: 9.3 mg/dL (ref 8.7–10.3)
Chloride: 105 mmol/L (ref 96–106)
Creatinine, Ser: 0.89 mg/dL (ref 0.57–1.00)
Globulin, Total: 2 g/dL (ref 1.5–4.5)
Glucose: 81 mg/dL (ref 70–99)
Potassium: 4.1 mmol/L (ref 3.5–5.2)
Sodium: 142 mmol/L (ref 134–144)
Total Protein: 6.4 g/dL (ref 6.0–8.5)
eGFR: 71 mL/min/1.73 (ref >59)

## 2024-02-28 LAB — CBC WITH DIFFERENTIAL/PLATELET
Basophils Absolute: 0 x10E3/uL (ref 0.0–0.2)
Basos: 1 %
EOS (ABSOLUTE): 0.1 x10E3/uL (ref 0.0–0.4)
Eos: 2 %
Hematocrit: 44.1 % (ref 34.0–46.6)
Hemoglobin: 14.6 g/dL (ref 11.1–15.9)
Immature Grans (Abs): 0 x10E3/uL (ref 0.0–0.1)
Immature Granulocytes: 0 %
Lymphocytes Absolute: 1.4 x10E3/uL (ref 0.7–3.1)
Lymphs: 33 %
MCH: 30.8 pg (ref 26.6–33.0)
MCHC: 33.1 g/dL (ref 31.5–35.7)
MCV: 93 fL (ref 79–97)
Monocytes Absolute: 0.3 x10E3/uL (ref 0.1–0.9)
Monocytes: 7 %
Neutrophils Absolute: 2.4 x10E3/uL (ref 1.4–7.0)
Neutrophils: 57 %
Platelets: 221 x10E3/uL (ref 150–450)
RBC: 4.74 x10E6/uL (ref 3.77–5.28)
RDW: 13.3 % (ref 11.7–15.4)
WBC: 4.2 x10E3/uL (ref 3.4–10.8)

## 2024-02-28 LAB — LIPID PANEL
Chol/HDL Ratio: 3 ratio (ref 0.0–4.4)
Cholesterol, Total: 263 mg/dL — ABNORMAL HIGH (ref 100–199)
HDL: 88 mg/dL (ref >39)
LDL Chol Calc (NIH): 166 mg/dL — ABNORMAL HIGH (ref 0–99)
Triglycerides: 57 mg/dL (ref 0–149)
VLDL Cholesterol Cal: 9 mg/dL (ref 5–40)

## 2024-02-28 LAB — TSH: TSH: 2.46 u[IU]/mL (ref 0.450–4.500)

## 2024-02-28 MED ORDER — CHLORHEXIDINE GLUCONATE 0.12 % MT SOLN: 15.0000 mL | Freq: Two times a day (BID) | OROMUCOSAL | 0 refills | Status: AC

## 2024-03-01 ENCOUNTER — Encounter: Payer: Self-pay | Admitting: Nurse Practitioner

## 2024-03-03 ENCOUNTER — Ambulatory Visit: Payer: Self-pay | Admitting: Pediatrics

## 2024-03-30 ENCOUNTER — Encounter: Admitting: Nurse Practitioner

## 2024-04-01 ENCOUNTER — Ambulatory Visit

## 2024-04-02 ENCOUNTER — Encounter: Admitting: Nurse Practitioner
